# Patient Record
Sex: Female | Born: 1970 | Race: White | Hispanic: No | Marital: Married | Smoking: Former smoker
Health system: Southern US, Community
[De-identification: ages and names within clinical notes are randomized; demographics above are authoritative.]

## PROBLEM LIST (undated history)

## (undated) DIAGNOSIS — R569 Unspecified convulsions: Secondary | ICD-10-CM

## (undated) DIAGNOSIS — H539 Unspecified visual disturbance: Secondary | ICD-10-CM

## (undated) DIAGNOSIS — G35D Multiple sclerosis, unspecified: Secondary | ICD-10-CM

## (undated) DIAGNOSIS — G35 Multiple sclerosis: Secondary | ICD-10-CM

## (undated) HISTORY — DX: Unspecified convulsions: R56.9

## (undated) HISTORY — DX: Multiple sclerosis, unspecified: G35.D

## (undated) HISTORY — DX: Multiple sclerosis: G35

## (undated) HISTORY — DX: Unspecified visual disturbance: H53.9

---

## 2014-12-14 ENCOUNTER — Encounter: Payer: Self-pay | Admitting: *Deleted

## 2014-12-15 ENCOUNTER — Encounter: Payer: Self-pay | Admitting: Neurology

## 2014-12-15 ENCOUNTER — Ambulatory Visit (INDEPENDENT_AMBULATORY_CARE_PROVIDER_SITE_OTHER): Payer: BLUE CROSS/BLUE SHIELD | Admitting: Neurology

## 2014-12-15 VITALS — BP 116/68 | HR 64 | Resp 14 | Ht 68.0 in | Wt 182.0 lb

## 2014-12-15 DIAGNOSIS — R5383 Other fatigue: Secondary | ICD-10-CM | POA: Diagnosis not present

## 2014-12-15 DIAGNOSIS — G40309 Generalized idiopathic epilepsy and epileptic syndromes, not intractable, without status epilepticus: Secondary | ICD-10-CM | POA: Diagnosis not present

## 2014-12-15 DIAGNOSIS — E559 Vitamin D deficiency, unspecified: Secondary | ICD-10-CM | POA: Diagnosis not present

## 2014-12-15 DIAGNOSIS — R269 Unspecified abnormalities of gait and mobility: Secondary | ICD-10-CM

## 2014-12-15 DIAGNOSIS — H532 Diplopia: Secondary | ICD-10-CM | POA: Diagnosis not present

## 2014-12-15 DIAGNOSIS — G35 Multiple sclerosis: Secondary | ICD-10-CM | POA: Diagnosis not present

## 2014-12-15 MED ORDER — MODAFINIL 200 MG PO TABS: 200.0000 mg | ORAL_TABLET | Freq: Two times a day (BID) | ORAL | Status: DC

## 2014-12-15 NOTE — Progress Notes (Signed)
[facility]  PATIENT: Ruth Anderson DOB: 01/16/1971  REFERRING DOCTOR OR PCP:  none SOURCE: patient, husband and records from [facility] neurology and images from [facility].  _________________________________   HISTORICAL  CHIEF COMPLAINT:  Chief Complaint  Patient presents with  . Multiple Sclerosis  . Seizures    HISTORY OF PRESENT ILLNESS:  Ruth Anderson is a 44 year old woman with multiple sclerosis and epilepsy who I have seen for many years while at [facility]. She was diagnosed with MS in 1988 placed on Betaseron when it became available.  Later on she switched to Copaxone and Avonex. When Tysabri became available in 2006, she switched and had 70 infusions over the next 6  years (monthly except for a 6 month holiday).   She tolerated Tysabri well, was stable and was JCV antibody negative. However, she did not like having to do the monthly to hour infusions. In May 2013, she started Tecfidera. She had flushing that persisted after many months but did not have significant GI issues. I last saw her on 07/29/2014 and she reported that her MS remains stable on the Tecfidera. I personally reviewed her MRI of the brain from 2015. It was compared to an MRI dated 12/12/2009. She has signal matter signal changes in the hemispheres consistent with multiple sclerosis. Many of these are radially oriented to the lateral ventricles in the corpus callosum and others are white matter. Brain stem appears normal. There were no acute findings and there was no significant changes when compared to the MRI dated 12/12/2009. Vitamin D was 32.9 in July 2015. Lymphocyte count was 0.8 on 07/29/2014.  Gait/strength/sensation:    She feels gait is baseline -- some stumbling and endurance issues but no recent falls.     She denies any significant weakness or numbness in legs or arms  Bladder/bowel:   She denies any issues with bowel or bladder  Vision: She notes more  blurry vision about a month ago.  She feels it is worse compared to last month but about the same as last week.    She saw her optometrist last week and was diagnosed with left optic neuritis.    She is worse when she is hot or tired.  She notes more blurriness when she is looking right.     Fatigue/sleep: Fatigue has been a problem for many years. At her fatigue did better on the Tysabri than on other medications. Provigil helps the fatigue some. She feels the quality of her sleep is good and she often sleeps in late.   She had one episode of lassitude last month lasting 1/2 day bu tmuch better the following day -- no virus, fever, etc.    Mood/cognition:   She denies any depression or anxiety.   She does feel cognition is slowly worsening.    She notes problems with attention, memory, processing speed and word finding.     She also has a seizure disorder. Her first seizure was in 1998 and they have been triggered by flashing Christmas tree lights. She has had about a dozen seizures overall with most of them occurring out of sleep but with a couple of them occurring during the daytime with severe ictal grogginess. For the most part, the seizures have been controlled on Lamictal 200 mg 3 times a day and Keppra 1500 mg twice a day. Her last seizure was in late 2014. This seizure was prolonged requiring visit to the emergency room where she received IV medications and  she was intubated overnight.  REVIEW OF SYSTEMS: Constitutional: No fevers, chills, sweats, or change in appetite.  Notes some fatigue Eyes: as above.  No eye pain Ear, nose and throat: No hearing loss, ear pain, nasal congestion, sore throat Cardiovascular: No chest pain, palpitations Respiratory: No shortness of breath at rest or with exertion.   No wheezes GastrointestinaI: No nausea, vomiting, diarrhea, abdominal pain, fecal incontinence Genitourinary: No dysuria, urinary retention or frequency.  No nocturia. Musculoskeletal: No  neck pain, back pain Integumentary: No rash, pruritus, skin lesions Neurological: as above Psychiatric: No depression at this time.  No anxiety Endocrine: No palpitations, diaphoresis, change in appetite, change in weigh or increased thirst Hematologic/Lymphatic: No anemia, purpura, petechiae. Allergic/Immunologic: No itchy/runny eyes, nasal congestion, recent allergic reactions, rashes  ALLERGIES: No Known Allergies  HOME MEDICATIONS:  Current outpatient prescriptions:  .  Dimethyl Fumarate 240 MG CPDR, Take 240 mg by mouth 2 (two) times daily., Disp: , Rfl:  .  lamoTRIgine (LAMICTAL) 200 MG tablet, Take 200 mg by mouth 3 (three) times daily., Disp: , Rfl:  .  levETIRAcetam (KEPPRA) 500 MG tablet, Take 500 mg by mouth 2 (two) times daily., Disp: , Rfl:  .  modafinil (PROVIGIL) 100 MG tablet, Take 100 mg by mouth 2 (two) times daily., Disp: , Rfl:   PAST MEDICAL HISTORY: Past Medical History  Diagnosis Date  . Seizures   . Vision abnormalities   . Multiple sclerosis     PAST SURGICAL HISTORY: History reviewed. No pertinent past surgical history.  FAMILY HISTORY: Family History  Problem Relation Age of Onset  . Hypertension Mother   . Heart disease Father   . Hypertension Father   . Stroke Father   . Diabetes type II Father     SOCIAL HISTORY:  History   Social History  . Marital Status: Married    Spouse Name: N/A  . Number of Children: N/A  . Years of Education: N/A   Occupational History  . Not on file.   Social History Main Topics  . Smoking status: Former Games developer  . Smokeless tobacco: Not on file  . Alcohol Use: No  . Drug Use: No  . Sexual Activity: Not on file   Other Topics Concern  . Not on file   Social History Narrative     PHYSICAL EXAM  Filed Vitals:   12/15/14 1259  BP: 116/68  Pulse: 64  Resp: 14  Height: 5\' 8"  (1.727 m)  Weight: 182 lb (82.555 kg)    Body mass index is 27.68 kg/(m^2).   General: The patient is  well-developed and well-nourished and in no acute distress  Eyes:  Funduscopic exam shows normal optic discs and retinal vessels.  Neck: The neck is supple, no carotid bruits are noted.  The neck is nontender.  Cardiovascular: The heart has a regular rate and rhythm with a normal S1 and S2. There were no murmurs, gallops or rubs. Lungs are clear to auscultation.  Skin: Extremities are without significant edema.  Musculoskeletal:  Back is nontender  Neurologic Exam  Mental status: The patient is alert and oriented x 3 at the time of the examination. The patient has apparent normal recent and remote memory, with an apparently normal attention span and concentration ability.   Speech is normal.  Cranial nerves: Extraocular movements show mild decrease abduction on the right. There is no nystagmus. Pupils are equal, round, and reactive to light and accomodation.  She reported symmetric colors. Visual fields are full.  Facial symmetry is present. There is good facial sensation to soft touch bilaterally.Facial strength is normal.  Trapezius and sternocleidomastoid strength is normal. No dysarthria is noted.  The tongue is midline, and the patient has symmetric elevation of the soft palate. No obvious hearing deficits are noted.  Motor:  Muscle bulk is normal.   Tone is mildly increased in leg. Strength is  5 / 5 in all 4 extremities.   Sensory: Sensory testing is intact to pinprick, soft touch and vibration sensation in all 4 extremities.  Coordination: Cerebellar testing reveals good finger-nose-finger and heel-to-shin bilaterally.  Gait and station: Station is normal.   Gait is normal. Tandem gait is wide. Romberg is negative.   Reflexes: Deep tendon reflexes are symmetric and brisk bilaterally.   Plantar responses are flexor.    DIAGNOSTIC DATA (LABS, IMAGING, TESTING) - I reviewed patient records, labs, notes, testing and imaging myself where available.     ASSESSMENT AND  PLAN  Multiple sclerosis - Plan: CBC with Differential, Sedimentation Rate, Vitamin D, 25-hydroxy  Epilepsy, generalized, convulsive  Other fatigue - Plan: CBC with Differential, Sedimentation Rate  Diplopia - Plan: CBC with Differential, Sedimentation Rate  Gait disturbance  Vitamin D deficiency - Plan: Vitamin D, 25-hydroxy   In summary, Acelynn Dejonge is a 44 year old woman with multiple sclerosis who had the onset of diplopia on right gaze last month. This likely represents a mild MS exacerbation. As her symptoms are more than 25 weeks old I do not think that she would benefit from a course of IV steroids. I suspect that she will improve over the next couple months. However, if she does not she may require prism glasses. To make sure that she is not having breakthrough disease on Tecfidera, we will check an MRI of the brain with and without contrast and compare the images with those from last year. If she is having a lot of breakthrough disease we will need to consider a change in therapy. I will also check her vitamin D level as it was low in the past and a CBC with differential to make sure that she does not have a lymphopenia. She is advised to take her Tecfidera after meals to help with the flushing. GU on her epilepsy medications as they seem to be helping her.  She will return to see me in 4 months or sooner if she has new or worsening neurologic symptoms.   Ruth Anderson A. Epimenio Foot, MD, PhD 12/15/2014, 1:09 PM Certified in Neurology, Clinical Neurophysiology, Sleep Medicine, Pain Medicine and Neuroimaging  [facility] [address] [place], [place] [address] [phone]

## 2014-12-16 ENCOUNTER — Telehealth: Payer: Self-pay | Admitting: *Deleted

## 2014-12-16 LAB — VITAMIN D 25 HYDROXY (VIT D DEFICIENCY, FRACTURES): Vit D, 25-Hydroxy: 28.2 ng/mL — ABNORMAL LOW (ref 30.0–100.0)

## 2014-12-16 LAB — CBC WITH DIFFERENTIAL/PLATELET
BASOS: 2 %
Basophils Absolute: 0.1 10*3/uL (ref 0.0–0.2)
EOS: 4 %
Eosinophils Absolute: 0.1 10*3/uL (ref 0.0–0.4)
HCT: 38.5 % (ref 34.0–46.6)
HEMOGLOBIN: 12.8 g/dL (ref 11.1–15.9)
Immature Grans (Abs): 0 10*3/uL (ref 0.0–0.1)
Immature Granulocytes: 0 %
LYMPHS: 31 %
Lymphocytes Absolute: 1.1 10*3/uL (ref 0.7–3.1)
MCH: 30.5 pg (ref 26.6–33.0)
MCHC: 33.2 g/dL (ref 31.5–35.7)
MCV: 92 fL (ref 79–97)
Monocytes Absolute: 0.4 10*3/uL (ref 0.1–0.9)
Monocytes: 11 %
Neutrophils Absolute: 1.8 10*3/uL (ref 1.4–7.0)
Neutrophils Relative %: 52 %
Platelets: 325 10*3/uL (ref 150–379)
RBC: 4.19 x10E6/uL (ref 3.77–5.28)
RDW: 13 % (ref 12.3–15.4)
WBC: 3.4 10*3/uL (ref 3.4–10.8)

## 2014-12-16 LAB — SEDIMENTATION RATE: Sed Rate: 2 mm/hr (ref 0–32)

## 2014-12-16 MED ORDER — VITAMIN D (ERGOCALCIFEROL) 1.25 MG (50000 UNIT) PO CAPS: 50000.0000 [IU] | ORAL_CAPSULE | ORAL | Status: DC

## 2014-12-16 NOTE — Telephone Encounter (Signed)
-----   Message from Asa Lente, MD sent at 12/16/2014 12:54 PM EDT ----- Vit D is milldy low ---  50000 U x 8 weeks then 11-4998 U OTC   Other labs are ok

## 2014-12-16 NOTE — Telephone Encounter (Signed)
Spoke with Ruth Anderson and per RAS, advised her of low vit. d and the need for rx. vit. d 50,000iu po weekly for 8 weeks, then resume otc vit. d 4-5,000iu daily.  Ruth Anderson verbalized understanding of same, and rx. escribed to [facility] per her request/fim

## 2014-12-29 ENCOUNTER — Telehealth: Payer: Self-pay | Admitting: Neurology

## 2014-12-29 NOTE — Telephone Encounter (Signed)
Patient is calling to discuss pain she is having. She would not give any other details. Please call.

## 2014-12-29 NOTE — Telephone Encounter (Signed)
I have spoken with Ruth Anderson.  She c/o neck, left arm pain onset few days ago.  Worse with turning her head to the left.  Denies other sx. such as increased fatigue, visual disturbance.  Does not feel this is an ms exacerbation.  I have offered an appt. with RAS--she will first try heat/ice therapy, ibuprofen.  If pain does not improve or worsens, she will call me back for appt/fim

## 2015-01-26 ENCOUNTER — Telehealth: Payer: Self-pay

## 2015-01-26 MED ORDER — DIMETHYL FUMARATE 240 MG PO CPDR: 240.0000 mg | DELAYED_RELEASE_CAPSULE | Freq: Two times a day (BID) | ORAL | Status: DC

## 2015-01-26 NOTE — Telephone Encounter (Signed)
Rx. escribed to [facility] as requested/fim

## 2015-01-26 NOTE — Telephone Encounter (Signed)
[facility] is requesting a refill on Tecfidera  capsules one twice daily.  Thank you.

## 2015-03-18 ENCOUNTER — Other Ambulatory Visit: Payer: Self-pay | Admitting: Neurology

## 2015-04-27 ENCOUNTER — Ambulatory Visit: Payer: BLUE CROSS/BLUE SHIELD | Admitting: Neurology

## 2015-05-03 ENCOUNTER — Encounter: Payer: Self-pay | Admitting: Neurology

## 2015-05-03 ENCOUNTER — Ambulatory Visit (INDEPENDENT_AMBULATORY_CARE_PROVIDER_SITE_OTHER): Payer: BLUE CROSS/BLUE SHIELD | Admitting: Neurology

## 2015-05-03 VITALS — BP 128/80 | HR 68 | Resp 16 | Ht 68.0 in | Wt 174.0 lb

## 2015-05-03 DIAGNOSIS — R269 Unspecified abnormalities of gait and mobility: Secondary | ICD-10-CM

## 2015-05-03 DIAGNOSIS — H532 Diplopia: Secondary | ICD-10-CM

## 2015-05-03 DIAGNOSIS — R5383 Other fatigue: Secondary | ICD-10-CM

## 2015-05-03 DIAGNOSIS — G40309 Generalized idiopathic epilepsy and epileptic syndromes, not intractable, without status epilepticus: Secondary | ICD-10-CM

## 2015-05-03 DIAGNOSIS — G35 Multiple sclerosis: Secondary | ICD-10-CM

## 2015-05-03 MED ORDER — LEVETIRACETAM 500 MG PO TABS: 500.0000 mg | ORAL_TABLET | Freq: Two times a day (BID) | ORAL | Status: DC

## 2015-05-03 MED ORDER — MODAFINIL 200 MG PO TABS: 200.0000 mg | ORAL_TABLET | Freq: Two times a day (BID) | ORAL | Status: DC

## 2015-05-03 MED ORDER — LAMICTAL 200 MG PO TABS: 200.0000 mg | ORAL_TABLET | Freq: Three times a day (TID) | ORAL | Status: DC

## 2015-05-03 NOTE — Progress Notes (Signed)
[facility]  PATIENT: Ruth Anderson DOB: 30-Nov-1970  REFERRING DOCTOR OR PCP:  none SOURCE: patient, husband and records from [facility] neurology and images from [facility].  _________________________________   HISTORICAL  CHIEF COMPLAINT:  Chief Complaint  Patient presents with  . Multiple Sclerosis    Sts. she continues to tolerate Tecfidera well.  Sts. fatigue is some worse, she thinks due to heat./fim  . Seizures    Denies new sz. activity/fim    HISTORY OF PRESENT ILLNESS:  Ruth Anderson is a 44 year old woman with multiple sclerosis and epilepsy  MS History:  She was diagnosed with MS in 1988 placed on Betaseron when it became available.  Later on she switched to Copaxone and Avonex. When Tysabri became available in 2006, she switched and had 70 infusions over the next 6  years (monthly except for a 6 month holiday).   She tolerated Tysabri well, was stable and was JCV antibody negative. However, she did not like having to do the monthly to hour infusions. In May 2013, she started Tecfidera. She had flushing that persisted after many months but did not have significant GI issues. I last saw her on 07/29/2014 and she reported that her MS remains stable on the Tecfidera. I personally reviewed her MRI of the brain from 2015. It was compared to an MRI dated 12/12/2009. She has signal matter signal changes in the hemispheres consistent with multiple sclerosis. Many of these are radially oriented to the lateral ventricles in the corpus callosum and others are white matter. Brain stem appears normal. There were no acute findings and there was no significant changes when compared to the MRI dated 12/12/2009. Vitamin D was 32.9 in July 2015. Lymphocyte count was 0.8 on 07/29/2014.  Gait/strength/sensation:    She feels gait is baseline.    She walks without a cane but tires out easily and then has stumbling.  No recent falls.     She denies any significant  weakness or numbness in legs or arms   No dysesthetic pain.  Bladder/bowel:   She denies any issues with bowel or bladder.     Vision: She notes some blurry vision that is worse with  looking to the right.  She feels it is worse compared to last month but about the same as last week.      She is worse when she is hot or tired.     Fatigue/sleep: Fatigue is a daily problem for her. Her fatigue did better on the Tysabri than on other medications. Provigil helps the fatigue some. She feels the quality of her sleep is good and she often sleeps in late.     Mood/cognition:   She denies depression or anxiety.   She has had mild fairly stable cognitive loss.    She notes problems with attention, memory, processing speed and word finding.   Provigil helps some.  Seizure:    Her last seizure was in 2014 and she needed intubation.   Her first seizure was in 1998 and they have been triggered by flashing Christmas tree lights. She has had about a dozen seizures overall with most of them occurring out of sleep but with a couple of them occurring during the daytime with severe ictal grogginess. For the most part, the seizures have been controlled on Lamictal 200 mg 3 times a day and Keppra 1500 mg twice a day.   REVIEW OF SYSTEMS: Constitutional: No fevers, chills, sweats, or change in appetite.  Notes  some fatigue Eyes: as above.  No eye pain Ear, nose and throat: No hearing loss, ear pain, nasal congestion, sore throat Cardiovascular: No chest pain, palpitations Respiratory: No shortness of breath at rest or with exertion.   No wheezes GastrointestinaI: No nausea, vomiting, diarrhea, abdominal pain, fecal incontinence Genitourinary: No dysuria, urinary retention or frequency.  No nocturia. Musculoskeletal: No neck pain, back pain Integumentary: No rash, pruritus, skin lesions Neurological: as above Psychiatric: No depression at this time.  No anxiety Endocrine: No palpitations, diaphoresis, change in  appetite, change in weigh or increased thirst Hematologic/Lymphatic: No anemia, purpura, petechiae. Allergic/Immunologic: No itchy/runny eyes, nasal congestion, recent allergic reactions, rashes  ALLERGIES: No Known Allergies  HOME MEDICATIONS:  Current outpatient prescriptions:  .  Dimethyl Fumarate 240 MG CPDR, Take 1 capsule (240 mg total) by mouth 2 (two) times daily., Disp: 60 capsule, Rfl: 11 .  LAMICTAL 200 MG tablet, TAKE 1 TABLET THREE TIMES A DAY, Disp: 270 tablet, Rfl: 1 .  levETIRAcetam (KEPPRA) 500 MG tablet, Take 500 mg by mouth 2 (two) times daily., Disp: , Rfl:  .  modafinil (PROVIGIL) 200 MG tablet, Take 1 tablet (200 mg total) by mouth 2 (two) times daily., Disp: 180 tablet, Rfl: 1 .  Vitamin D, Ergocalciferol, (DRISDOL) 50000 UNITS CAPS capsule, Take 1 capsule (50,000 Units total) by mouth every 7 (seven) days., Disp: 8 capsule, Rfl: 0  PAST MEDICAL HISTORY: Past Medical History  Diagnosis Date  . Seizures   . Vision abnormalities   . Multiple sclerosis     PAST SURGICAL HISTORY: History reviewed. No pertinent past surgical history.  FAMILY HISTORY: Family History  Problem Relation Age of Onset  . Hypertension Mother   . Heart disease Father   . Hypertension Father   . Stroke Father   . Diabetes type II Father     SOCIAL HISTORY:  Social History   Social History  . Marital Status: Married    Spouse Name: N/A  . Number of Children: N/A  . Years of Education: N/A   Occupational History  . Not on file.   Social History Main Topics  . Smoking status: Former Games developer  . Smokeless tobacco: Not on file  . Alcohol Use: No  . Drug Use: No  . Sexual Activity: Not on file   Other Topics Concern  . Not on file   Social History Narrative     PHYSICAL EXAM  Filed Vitals:   05/03/15 1553  BP: 128/80  Pulse: 68  Resp: 16  Height: 5\' 8"  (1.727 m)  Weight: 174 lb (78.926 kg)    Body mass index is 26.46 kg/(m^2).   General: The patient is  well-developed and well-nourished and in no acute distress  Eyes:  Funduscopic exam shows normal optic discs and retinal vessels.  Neck: The neck is supple, no carotid bruits are noted.  The neck is nontender.  Cardiovascular: The heart has a regular rate and rhythm with a normal S1 and S2. There were no murmurs, gallops or rubs. Lungs are clear to auscultation.  Skin: Extremities are without significant edema.  Musculoskeletal:  Back is nontender  Neurologic Exam  Mental status: The patient is alert and oriented x 3 at the time of the examination. The patient has apparent normal recent and remote memory, with an apparently normal attention span and concentration ability.   Speech is normal.  Cranial nerves: Extraocular movements show mild diplopia to right (esp right and up gaze).   There is no  nystagmus. Pupils are equal, round, and reactive to light and accomodation.  She reported symmetric colors. Visual fields are full.  Facial symmetry is present. There is good facial sensation to soft touch bilaterally.Facial strength is normal.  Trapezius and sternocleidomastoid strength is normal. No dysarthria is noted.   No obvious hearing deficits are noted.  Motor:  Muscle bulk is normal.   Tone is mildly increased in leg. Strength is  5 / 5 in all 4 extremities.   Sensory: Sensory testing is intact to pinprick, soft touch and vibration sensation in all 4 extremities.  Coordination: Cerebellar testing reveals good finger-nose-finger and heel-to-shin bilaterally.  Gait and station: Station is normal.   Gait is normal. Tandem gait is wide. Romberg is negative.   Reflexes: Deep tendon reflexes are symmetric and brisk bilaterally.   Plantar responses are flexor.    DIAGNOSTIC DATA (LABS, IMAGING, TESTING) - I reviewed patient records, labs, notes, testing and imaging myself where available.     ASSESSMENT AND PLAN  Multiple sclerosis  Epilepsy, generalized, convulsive  Gait  disturbance  Other fatigue  Diplopia   1.   Her diplopia is slightly better but not resolved. If she is no better in 2-3 months, consider referring to a neuro-ophthalmologist for prism glasses.    2.   She will continue on her current dose of lamotrigine and Keppra (brand necessary). 3.  Continue Provigil for MS related fatigue and sleepiness. 4.   She will continue to stay active and exercises as tolerated. 5.  She will return to see me in 6 months or sooner if she has new or worsening neurologic symptoms.   Richard A. Epimenio Foot, MD, PhD 05/03/2015, 4:03 PM Certified in Neurology, Clinical Neurophysiology, Sleep Medicine, Pain Medicine and Neuroimaging  [facility] [address], [address] [place], [place] [address] [phone]

## 2015-05-09 ENCOUNTER — Telehealth: Payer: Self-pay | Admitting: Neurology

## 2015-05-09 MED ORDER — LEVETIRACETAM 500 MG PO TABS: 500.0000 mg | ORAL_TABLET | Freq: Two times a day (BID) | ORAL | Status: DC

## 2015-05-09 NOTE — Telephone Encounter (Signed)
Pt called and stated that the pharmacy has not received the information needed for her Keppra.  They say they have received the rx but all the information is not on it to be able to fill it.  The best number to reach her is [phone].

## 2015-05-09 NOTE — Telephone Encounter (Signed)
I have spoken with Ruth Anderson this morning.  RAS escribed Keppra rx. to [facility] on 05-03-15, and I have sent it again this morning/fim

## 2015-05-16 ENCOUNTER — Telehealth: Payer: Self-pay | Admitting: Neurology

## 2015-05-16 MED ORDER — DIMETHYL FUMARATE 240 MG PO CPDR: 240.0000 mg | DELAYED_RELEASE_CAPSULE | Freq: Two times a day (BID) | ORAL | Status: DC

## 2015-05-16 MED ORDER — LEVETIRACETAM 750 MG PO TABS: 1500.0000 mg | ORAL_TABLET | Freq: Two times a day (BID) | ORAL | Status: DC

## 2015-05-16 MED ORDER — LEVETIRACETAM 750 MG PO TABS: 750.0000 mg | ORAL_TABLET | Freq: Two times a day (BID) | ORAL | Status: DC

## 2015-05-16 MED ORDER — NITROFURANTOIN MONOHYD MACRO 100 MG PO CAPS: 100.0000 mg | ORAL_CAPSULE | Freq: Two times a day (BID) | ORAL | Status: DC

## 2015-05-16 NOTE — Telephone Encounter (Signed)
I have spoken with Ruth Anderson this morning.  She has been taking Keppra 1500mg  bid.  Last rx. sent in was for 500mg  bid.  She will take 3 500mg  tabs, for total of 1500mg  bid, and I have escribed new rx. for Keppra 750mg  2 tabs bid, brand name medically necessary/fim

## 2015-05-16 NOTE — Telephone Encounter (Addendum)
Patient is calling and states she receives her Rx Levetiracetam by mail.  She stated she has been taking 2 tablets 750 mg tablets at a time 2 times a day and stated she received her last prescription for Levetiracetam @1  tablet 500mg  twice per day.  Please call patient.

## 2015-05-16 NOTE — Telephone Encounter (Signed)
Patient called stating she was informed today by [facility] the Tecfidera had been denied in May 2016. She is just now needing refill. She will be out this week. She is requesting a call to be made to them @ [phone]. Please call and advise. Patient can be reached at [phone].

## 2015-05-16 NOTE — Telephone Encounter (Signed)
I have spoken with Ruth Anderson.  She requested Tecfidera rx. be sent to [facility], and Macrodantin be sent to [facility].  I have escribed rx's as requested/fim

## 2015-07-18 ENCOUNTER — Telehealth: Payer: Self-pay

## 2015-07-18 MED ORDER — IBUPROFEN 800 MG PO TABS: 800.0000 mg | ORAL_TABLET | Freq: Three times a day (TID) | ORAL | Status: DC | PRN

## 2015-07-18 NOTE — Telephone Encounter (Signed)
[facility] is requesting a new Rx for Ibuprofen 800mg  one three times daily #270.  This drug is not currently on med list.  Okay to add and send Rx?  Please advise.  Thank you!

## 2015-07-18 NOTE — Telephone Encounter (Signed)
Thank you!  Rx has been added and sent.  Receipt confirmed by pharmacy.

## 2015-07-18 NOTE — Telephone Encounter (Signed)
Ok to add and send in script.    Thank you,

## 2015-09-02 ENCOUNTER — Encounter: Payer: Self-pay | Admitting: Neurology

## 2015-10-31 ENCOUNTER — Encounter: Payer: Self-pay | Admitting: Neurology

## 2015-10-31 ENCOUNTER — Ambulatory Visit (INDEPENDENT_AMBULATORY_CARE_PROVIDER_SITE_OTHER): Payer: BLUE CROSS/BLUE SHIELD | Admitting: Neurology

## 2015-10-31 VITALS — BP 122/86 | HR 80 | Resp 16 | Ht 68.0 in | Wt 184.0 lb

## 2015-10-31 DIAGNOSIS — G40309 Generalized idiopathic epilepsy and epileptic syndromes, not intractable, without status epilepticus: Secondary | ICD-10-CM

## 2015-10-31 DIAGNOSIS — H532 Diplopia: Secondary | ICD-10-CM | POA: Diagnosis not present

## 2015-10-31 DIAGNOSIS — G35 Multiple sclerosis: Secondary | ICD-10-CM | POA: Diagnosis not present

## 2015-10-31 DIAGNOSIS — R3911 Hesitancy of micturition: Secondary | ICD-10-CM | POA: Diagnosis not present

## 2015-10-31 DIAGNOSIS — R5383 Other fatigue: Secondary | ICD-10-CM | POA: Diagnosis not present

## 2015-10-31 DIAGNOSIS — R269 Unspecified abnormalities of gait and mobility: Secondary | ICD-10-CM | POA: Diagnosis not present

## 2015-10-31 DIAGNOSIS — R4189 Other symptoms and signs involving cognitive functions and awareness: Secondary | ICD-10-CM | POA: Diagnosis not present

## 2015-10-31 DIAGNOSIS — N3000 Acute cystitis without hematuria: Secondary | ICD-10-CM

## 2015-10-31 DIAGNOSIS — N390 Urinary tract infection, site not specified: Secondary | ICD-10-CM | POA: Insufficient documentation

## 2015-10-31 MED ORDER — NITROFURANTOIN MONOHYD MACRO 100 MG PO CAPS: 100.0000 mg | ORAL_CAPSULE | Freq: Two times a day (BID) | ORAL | Status: DC

## 2015-10-31 MED ORDER — MODAFINIL 200 MG PO TABS: 200.0000 mg | ORAL_TABLET | Freq: Two times a day (BID) | ORAL | Status: DC

## 2015-10-31 NOTE — Progress Notes (Signed)
[facility]  PATIENT: Ruth Anderson DOB: May 27, 1971  REFERRING DOCTOR OR PCP:  none SOURCE: patient, husband and records from [facility] neurology and images from [facility].  _________________________________   HISTORICAL  CHIEF COMPLAINT:  Chief Complaint  Patient presents with  . Multiple Sclerosis    Sts. she continues to tolerate Tecfidera well.  Sts. she has had cold sx. , increased fatigue for the last 4 days.  She denies new sx. activity and sts. is compliant with brand name Lamotrigine and Keppra.  She needs a r/f of Macrobid, which she takes daily to prevent uti's/fim  . Seizures    HISTORY OF PRESENT ILLNESS:  Ruth Anderson is a 45 year old woman with multiple sclerosis and epilepsy.    She is on Tecfidera and tolerates it well.  She deneis any actual exacerbation but has noted more numbness of fingers in both hands.      Gait/strength/sensation:    She feels gait is baseline.  She has no recent falls (only ne in last 6 months when she hit something while walking).    She walks without a cane but tires out easily and then has stumbling.    She denies any significant weakness but legs tire out easily and rarely give out.   She has some numbness in her hands.     No dysesthetic pain.  Bladder/bowel:   She denies any issues with bowel.   She has some urinary urgency but no incontinence.    She has nocturia.   She has more hesitancy and does not always empty.   She has had frequent UTI's and used ot be on Macrobid with benefit.     She thinks she may be having a UTI.    Vision: She notes some diplopia with gaze to the right.  She feels it is worse.     Diplopia is worse when she is tired.     Fatigue/sleep: Fatigue is variable, bad some days.   Her fatigue did better on the Tysabri than on Tecfidera. Provigil helps the fatigue some. She feels the quality of her sleep is good and she often sleeps in late.     Mood/cognition:   She denies major  depression or anxiety. However, she has been sadder for a month or two after her sister died.     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    She notes problems with attention, memory, processing speed and word finding.   Provigil helps some.  Seizure:    Her last seizure was in 2014 and she needed intubation.   Her first seizure was in 1998 and they have been triggered by flashing Christmas tree lights. She has had about a dozen seizures overall with most of them occurring out of sleep but with a couple of them occurring during the daytime with severe ictal grogginess. For the most part, the seizures have been controlled on Lamictal 200 mg 3 times a day and Keppra 1500 mg twice a day.    Of note, she had breakthrough seizure on generic so we have been writing for the brand.      MS History:  She was diagnosed with MS in 1988 placed on Betaseron when it became available.  Later on she switched to Copaxone and Avonex. When Tysabri became available in 2006, she switched and had 70 infusions over the next 6  years (monthly except for a 6 month holiday).   She  tolerated Tysabri well, was stable and was JCV antibody negative. However, she did not like having to do the monthly to hour infusions. In May 2013, she started Tecfidera. She had flushing that persisted after many months but did not have significant GI issues. I last saw her on 07/29/2014 and she reported that her MS remains stable on the Tecfidera. I personally reviewed her MRI of the brain from 2015. It was compared to an MRI dated 12/12/2009. She has signal matter signal changes in the hemispheres consistent with multiple sclerosis. Many of these are radially oriented to the lateral ventricles in the corpus callosum and others are white matter. Brain stem appears normal. There were no acute findings and there was no significant changes when compared to the MRI dated 12/12/2009. Vitamin D was 32.9 in July 2015. Lymphocyte count  was 0.8 on 07/29/2014.   REVIEW OF SYSTEMS: Constitutional: No fevers, chills, sweats, or change in appetite.  Notes some fatigue Eyes: as above.  No eye pain Ear, nose and throat: No hearing loss, ear pain, nasal congestion, sore throat Cardiovascular: No chest pain, palpitations Respiratory: No shortness of breath at rest or with exertion.   No wheezes GastrointestinaI: No nausea, vomiting, diarrhea, abdominal pain, fecal incontinence Genitourinary: No dysuria, urinary retention or frequency.  No nocturia. Musculoskeletal: No neck pain, back pain Integumentary: No rash, pruritus, skin lesions Neurological: as above Psychiatric: No depression at this time.  No anxiety Endocrine: No palpitations, diaphoresis, change in appetite, change in weigh or increased thirst Hematologic/Lymphatic: No anemia, purpura, petechiae. Allergic/Immunologic: No itchy/runny eyes, nasal congestion, recent allergic reactions, rashes  ALLERGIES: No Known Allergies  HOME MEDICATIONS:  Current outpatient prescriptions:  .  Dimethyl Fumarate 240 MG CPDR, Take 1 capsule (240 mg total) by mouth 2 (two) times daily., Disp: 180 capsule, Rfl: 3 .  ibuprofen (ADVIL,MOTRIN) 800 MG tablet, Take 1 tablet (800 mg total) by mouth 3 (three) times daily as needed., Disp: 270 tablet, Rfl: 1 .  LAMICTAL 200 MG tablet, Take 1 tablet (200 mg total) by mouth 3 (three) times daily. BRAND NAME LAMICTAL FOR SEIZURE DISORDER, Disp: 270 tablet, Rfl: 3 .  levETIRAcetam (KEPPRA) 750 MG tablet, Take 2 tablets (1,500 mg total) by mouth 2 (two) times daily., Disp: 360 tablet, Rfl: 3 .  modafinil (PROVIGIL) 200 MG tablet, Take 1 tablet (200 mg total) by mouth 2 (two) times daily., Disp: 180 tablet, Rfl: 1 .  nitrofurantoin, macrocrystal-monohydrate, (MACROBID) 100 MG capsule, Take 1 capsule (100 mg total) by mouth 2 (two) times daily., Disp: 180 capsule, Rfl: 3 .  Vitamin D, Ergocalciferol, (DRISDOL) 50000 UNITS CAPS capsule, Take 1  capsule (50,000 Units total) by mouth every 7 (seven) days., Disp: 8 capsule, Rfl: 0  PAST MEDICAL HISTORY: Past Medical History  Diagnosis Date  . Seizures (HCC)   . Vision abnormalities   . Multiple sclerosis (HCC)     PAST SURGICAL HISTORY: History reviewed. No pertinent past surgical history.  FAMILY HISTORY: Family History  Problem Relation Age of Onset  . Hypertension Mother   . Heart disease Father   . Hypertension Father   . Stroke Father   . Diabetes type II Father     SOCIAL HISTORY:  Social History   Social History  . Marital Status: Married    Spouse Name: N/A  . Number of Children: N/A  . Years of Education: N/A   Occupational History  . Not on file.   Social History Main Topics  . Smoking status:  Former Smoker  . Smokeless tobacco: Not on file  . Alcohol Use: No  . Drug Use: No  . Sexual Activity: Not on file   Other Topics Concern  . Not on file   Social History Narrative     PHYSICAL EXAM  Filed Vitals:   10/31/15 1058  BP: 122/86  Pulse: 80  Resp: 16  Height: 5\' 8"  (1.727 m)  Weight: 184 lb (83.462 kg)    Body mass index is 27.98 kg/(m^2).   General: The patient is well-developed and well-nourished and in no acute distress  Eyes:  Funduscopic exam shows normal optic discs and retinal vessels.  Neck: The neck is supple, no carotid bruits are noted.  The neck is nontender.  Cardiovascular: The heart has a regular rate and rhythm with a normal S1 and S2. There were no murmurs, gallops or rubs.    Skin: Extremities are without significant edema.  Musculoskeletal:  Back is nontender  Neurologic Exam  Mental status: The patient is alert and oriented x 3 at the time of the examination. The patient has apparent normal recent and remote memory, with an apparently normal attention span and concentration ability.   Speech is normal.  Cranial nerves: Extraocular movements show mild diplopia to right (esp right and up gaze).   There  is no nystagmus. Pupils are equal, round, and reactive to light and accomodation.  She reported symmetric colors. Visual fields are full.  Facial symmetry is present. There is good facial sensation to soft touch bilaterally.Facial strength is normal.  Trapezius and sternocleidomastoid strength is normal. No dysarthria is noted.   No obvious hearing deficits are noted.  Motor:  Muscle bulk is normal.   Tone is mildly increased in leg. Strength is  5 / 5 in all 4 extremities.   Sensory: Sensory testing is intact to pinprick, soft touch and vibration sensation in all 4 extremities.  Coordination: Cerebellar testing reveals good finger-nose-finger and heel-to-shin bilaterally.  Gait and station: Station is normal.   Gait is normal. Tandem gait is wide. Romberg is negative.   Reflexes: Deep tendon reflexes are symmetric and brisk bilaterally.        DIAGNOSTIC DATA (LABS, IMAGING, TESTING) - I reviewed patient records, labs, notes, testing and imaging myself where available.     ASSESSMENT AND PLAN  Multiple sclerosis (HCC) - Plan: CBC with Differential/Platelet, MR Brain W Wo Contrast  Epilepsy, generalized, convulsive (HCC) - Plan: MR Brain W Wo Contrast  Gait disturbance - Plan: MR Brain W Wo Contrast  Diplopia  Other fatigue  Acute cystitis without hematuria  Urinary hesitancy  Disturbed cognition    1.   Continue Tecfidera for MS as her DMT. We will check a CBC with differential today to make sure that there is not severe lymphopenia. If present, we will need to switch to a different medication. Additionally, we will check an MRI of the brain to make sure that she has not had subclinical progression of her MS well on Tecfidera. 2.   We discussed prism glasses for her diplopia. At this time, she would like to hold off on referral.  3.   She will continue on her current dose of Lamictal and Keppra (brand necessary as she had a severe breakthrough seizure while on generic  requiring intubation).   She is advised to get a good night rest daily. 4.  Continue Provigil for MS related fatigue and sleepiness.   I will renew today 5.   She  will continue to stay active and exercises as tolerated. 6.  She will return to see me in 6 months or sooner if she has new or worsening neurologic symptoms.  40 minute face-to-face interaction with greater than one half of the time counseling or coordinating care about her MS and related symptoms.  Khalessi Blough A. Zailynn Brandel, MD, PhD 10/31/2015, 11:11[facility] Certified in Neurology, Clinical Neurophysiology, Sleep Medicine, Pain Medicine and Neuroimaging  [facility] [address], [address] [place], [place] [address] [phone]

## 2015-11-01 ENCOUNTER — Telehealth: Payer: Self-pay | Admitting: *Deleted

## 2015-11-01 LAB — CBC WITH DIFFERENTIAL/PLATELET
BASOS: 1 %
Basophils Absolute: 0 10*3/uL (ref 0.0–0.2)
EOS (ABSOLUTE): 0.1 10*3/uL (ref 0.0–0.4)
EOS: 2 %
HEMATOCRIT: 41.1 % (ref 34.0–46.6)
Hemoglobin: 13.9 g/dL (ref 11.1–15.9)
Immature Grans (Abs): 0 10*3/uL (ref 0.0–0.1)
Immature Granulocytes: 0 %
LYMPHS ABS: 1.1 10*3/uL (ref 0.7–3.1)
Lymphs: 28 %
MCH: 30.7 pg (ref 26.6–33.0)
MCHC: 33.8 g/dL (ref 31.5–35.7)
MCV: 91 fL (ref 79–97)
MONOS ABS: 0.6 10*3/uL (ref 0.1–0.9)
Monocytes: 14 %
NEUTROS ABS: 2.2 10*3/uL (ref 1.4–7.0)
Neutrophils: 55 %
Platelets: 214 10*3/uL (ref 150–379)
RBC: 4.53 x10E6/uL (ref 3.77–5.28)
RDW: 13.3 % (ref 12.3–15.4)
WBC: 3.9 10*3/uL (ref 3.4–10.8)

## 2015-11-01 NOTE — Telephone Encounter (Signed)
-----   Message from Asa Lente, MD sent at 11/01/2015 10:12 AM EST ----- Please note that the blood count looks good.

## 2015-11-01 NOTE — Telephone Encounter (Signed)
I have spoken with Ruth Anderson and per RAS, advised that labs done in our office look good; she can continue meds as rx'd.  She verbalized understanding of same/fim

## 2015-11-02 ENCOUNTER — Ambulatory Visit (INDEPENDENT_AMBULATORY_CARE_PROVIDER_SITE_OTHER): Payer: BLUE CROSS/BLUE SHIELD

## 2015-11-02 DIAGNOSIS — G40309 Generalized idiopathic epilepsy and epileptic syndromes, not intractable, without status epilepticus: Secondary | ICD-10-CM | POA: Diagnosis not present

## 2015-11-02 DIAGNOSIS — R269 Unspecified abnormalities of gait and mobility: Secondary | ICD-10-CM

## 2015-11-02 DIAGNOSIS — G35 Multiple sclerosis: Secondary | ICD-10-CM | POA: Diagnosis not present

## 2015-11-03 MED ORDER — GADOPENTETATE DIMEGLUMINE 469.01 MG/ML IV SOLN: 17.0000 mL | Freq: Once | INTRAVENOUS | Status: AC | PRN

## 2015-11-04 ENCOUNTER — Telehealth: Payer: Self-pay | Admitting: *Deleted

## 2015-11-04 MED ORDER — AZITHROMYCIN 250 MG PO TABS: ORAL_TABLET | ORAL | Status: DC

## 2015-11-04 NOTE — Telephone Encounter (Signed)
I have spoken with Ruth Anderson and per RAS, advised that mri brain showed MS plaques that were unchanged from the 2014 mri.  Advised she also had chronic sinusitis.  She does report some sx. of chronic sinusitis and requests Z-pk. be escribed to [facility].  I have done this/fim

## 2015-11-04 NOTE — Telephone Encounter (Signed)
-----   Message from Asa Lente, MD sent at 11/04/2015 11:46 AM EST ----- Please let her know that the MRI of the brain shows her MS plaques that appeared to be unchanged when compared to the 2014 MRI. She does have some chronic sinusitis. If she has any symptoms related to chronic sinusitis, please let me know and we can call in a Z-Pak

## 2015-11-08 ENCOUNTER — Telehealth: Payer: Self-pay | Admitting: Neurology

## 2015-11-08 NOTE — Telephone Encounter (Signed)
Patient is calling about her medication for fatigue. She was unprepared and did not know the name of it. She is requesting a call back.

## 2015-11-08 NOTE — Telephone Encounter (Signed)
I have spoken with Ruth Anderson--she would like future Provigil rx's to be faxed to [facility]./fim

## 2015-11-14 ENCOUNTER — Telehealth: Payer: Self-pay | Admitting: Neurology

## 2015-11-14 NOTE — Telephone Encounter (Signed)
[facility] is calling to get a renewal for Rx modafinil 200 mg tablets.  Please call #[phone] and use reference [id number].  Thanks!

## 2015-11-14 NOTE — Telephone Encounter (Signed)
Rx. for Modafinil 200mg  # 180 with one additional r/f; sig: one po bid was escribed to [facility] on 10-31-15.  I have given verbal confirmation of that rx. today/fim

## 2016-01-13 ENCOUNTER — Telehealth: Payer: Self-pay | Admitting: *Deleted

## 2016-01-13 NOTE — Telephone Encounter (Signed)
------------------------------------------------------------   HILIANA EILTS               CID [id number]  Patient SAME                 Pt's Dr Ep[facility] Foot        Area Code 336 Phone# [phone] * DOB September 09, 2070    RE WOULD LIKE TO KNOW WHEN SHE STARTED ONE OF        HER MEDICATIONS                                      Disp:Y/N N If Y = C/B If No Response In ============================================================

## 2016-01-13 NOTE — Telephone Encounter (Signed)
Returned pt TC. She would like to know when she first started taking Provigil. Let her know that 1st order recorded in [facility] was April 2016 but she says that isn't correct. Reports that she's been taking it 10 yrs or more. Suggested that she could also check w/ her pharmacy.

## 2016-01-19 ENCOUNTER — Telehealth: Payer: Self-pay | Admitting: *Deleted

## 2016-01-19 MED ORDER — IBUPROFEN 800 MG PO TABS: 800.0000 mg | ORAL_TABLET | Freq: Three times a day (TID) | ORAL | Status: DC | PRN

## 2016-01-19 NOTE — Telephone Encounter (Signed)
Ibuprofen escribed to [facility] per faxed request/fim 

## 2016-02-13 ENCOUNTER — Telehealth: Payer: Self-pay | Admitting: Neurology

## 2016-02-13 NOTE — Telephone Encounter (Signed)
I have spoken with Ruth Anderson this afternoon.  She asked if she has to see a specialist for prism glasses for diplopia, as discussed at last ov.  Per RAS, I have advised just about any opthal.  should be able to arrange for these.  She verbalized understanding of same--will f/u with her opthal./fim

## 2016-02-13 NOTE — Telephone Encounter (Signed)
Pt called in to speak with nurse about her vision. Please call and advise

## 2016-05-02 ENCOUNTER — Ambulatory Visit (INDEPENDENT_AMBULATORY_CARE_PROVIDER_SITE_OTHER): Payer: BLUE CROSS/BLUE SHIELD | Admitting: Neurology

## 2016-05-02 ENCOUNTER — Encounter: Payer: Self-pay | Admitting: Neurology

## 2016-05-02 VITALS — BP 126/74 | HR 76 | Resp 16 | Ht 68.0 in | Wt 178.5 lb

## 2016-05-02 DIAGNOSIS — H532 Diplopia: Secondary | ICD-10-CM

## 2016-05-02 DIAGNOSIS — R5383 Other fatigue: Secondary | ICD-10-CM | POA: Diagnosis not present

## 2016-05-02 DIAGNOSIS — G35 Multiple sclerosis: Secondary | ICD-10-CM

## 2016-05-02 DIAGNOSIS — E669 Obesity, unspecified: Secondary | ICD-10-CM | POA: Diagnosis not present

## 2016-05-02 DIAGNOSIS — R269 Unspecified abnormalities of gait and mobility: Secondary | ICD-10-CM

## 2016-05-02 DIAGNOSIS — G40309 Generalized idiopathic epilepsy and epileptic syndromes, not intractable, without status epilepticus: Secondary | ICD-10-CM

## 2016-05-02 MED ORDER — LEVETIRACETAM 750 MG PO TABS: 1500.0000 mg | ORAL_TABLET | Freq: Two times a day (BID) | ORAL | 3 refills | Status: DC

## 2016-05-02 MED ORDER — MODAFINIL 200 MG PO TABS: 200.0000 mg | ORAL_TABLET | Freq: Two times a day (BID) | ORAL | 1 refills | Status: DC

## 2016-05-02 MED ORDER — PHENTERMINE HCL 37.5 MG PO CAPS: 37.5000 mg | ORAL_CAPSULE | ORAL | 5 refills | Status: DC

## 2016-05-02 MED ORDER — LAMICTAL 200 MG PO TABS: 200.0000 mg | ORAL_TABLET | Freq: Three times a day (TID) | ORAL | 3 refills | Status: DC

## 2016-05-02 NOTE — Progress Notes (Signed)
[facility]  PATIENT: Ruth Anderson DOB: September 07, 1970  REFERRING DOCTOR OR PCP:  none SOURCE: patient, husband and records [facility] and images from [facility].  _________________________________   HISTORICAL  CHIEF COMPLAINT:  Chief Complaint  Patient presents with  . Multiple Sclerosis    Sts. she continues to tolerate Tecfidera well.  Sts. she gets fatigued easier.  Sts. is compliant with brand name keppra and Lamictal, and denies recent sz. actiity.  Last sz. was almost 2 yrs. ago/fim  . Seizures    HISTORY OF PRESENT ILLNESS:  Ruth NumbersLeigh Tetterton is a 45 year old woman with multiple sclerosis and epilepsy.    She is on Tecfidera and tolerates it well.  She deneis any actual exacerbation but has noted more numbness of fingers in both hands.    I personally reviewed the MRI of the brain dated 11/04/2015 and compared to  the MRI from 11/25/2012. There is no definite change in the interim  Gait/strength/sensation:    She feels gait is baseline with frequent stumbles and falls every month or so.   She has a cane but walks without it.   She also but tires out easily and then has stumbling.    She notes mild left leg weakness with foot drop when tired.   E    She has some numbness in her hands.     No dysesthetic pain.  Bladder/bowel:   She has some urinary urgency but no incontinence.    She has nocturia.   She has more hesitancy and does not always empty.   She has had frequent UTI's and used ot be on Macrobid with benefit.     She thinks she may be having a UTI.   She denies any issues with bowel.      She prefers not to take any more med's.     Vision: She notes some diplopia with gaze to the right.  She feels it is worse.     Diplopia is worse when she is tired.     Fatigue/sleep: Fatigue is worse, both mental and physical.   Her fatigue did better on the Tysabri than on Tecfidera. Provigil helps the fatigue some but not completely. She feels the  quality of her sleep is good and she often sleeps in late.   Ritalin helped fatigue in the past some also.    Mood/cognition:   She denies major depression or anxiety. However, she has been sadder for a month or two after her sister died.     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    She notes problems with attention, memory, processing speed and word finding.   Provigil helps some.   In the past, she was on Ritalin with benefit and better attention/focus.     Seizure:    Her last seizure was in 2014 and she needed intubation.   Her first seizure was in 1998 and they have been triggered by flashing Christmas tree lights. She has had about a dozen seizures overall with most of them occurring out of sleep but with a couple of them occurring during the daytime with severe ictal grogginess. For the most part, the seizures have been controlled on Lamictal 200 mg 3 times a day and Keppra 1500 mg twice a day.    Of note, she had breakthrough seizure on generic so we have been writing for the brand.      MS History:  She was diagnosed with MS in 1988 placed on Betaseron when it became available.  Later on she switched to Copaxone and Avonex. When Tysabri became available in 2006, she switched and had 70 infusions over the next 6  years (monthly except for a 6 month holiday).   She tolerated Tysabri well, was stable and was JCV antibody negative. However, she did not like having to do the monthly to hour infusions. In May 2013, she started Tecfidera. She had flushing that persisted after many months but did not have significant GI issues. I last saw her on 07/29/2014 and she reported that her MS remains stable on the Tecfidera. I personally reviewed her MRI of the brain from 2015. It was compared to an MRI dated 12/12/2009. She has signal matter signal changes in the hemispheres consistent with multiple sclerosis. Many of these are radially oriented to the lateral ventricles in the corpus  callosum and others are white matter. Brain stem appears normal. There were no acute findings and there was no significant changes when compared to the MRI dated 12/12/2009. Vitamin D was 32.9 in July 2015. Lymphocyte count was 0.8 on 07/29/2014.   REVIEW OF SYSTEMS: Constitutional: No fevers, chills, sweats, or change in appetite.  Notes some fatigue Eyes: as above.  No eye pain Ear, nose and throat: No hearing loss, ear pain, nasal congestion, sore throat Cardiovascular: No chest pain, palpitations Respiratory: No shortness of breath at rest or with exertion.   No wheezes GastrointestinaI: No nausea, vomiting, diarrhea, abdominal pain, fecal incontinence Genitourinary: No dysuria, urinary retention or frequency.  No nocturia. Musculoskeletal: No neck pain, back pain Integumentary: No rash, pruritus, skin lesions Neurological: as above Psychiatric: No depression at this time.  No anxiety Endocrine: No palpitations, diaphoresis, change in appetite, change in weigh or increased thirst Hematologic/Lymphatic: No anemia, purpura, petechiae. Allergic/Immunologic: No itchy/runny eyes, nasal congestion, recent allergic reactions, rashes  ALLERGIES: No Known Allergies  HOME MEDICATIONS:  Current Outpatient Prescriptions:  .  azithromycin (ZITHROMAX Z-PAK) 250 MG tablet, Take one tablet daily until gone.  May give generic., Disp: 6 each, Rfl: 0 .  Dimethyl Fumarate 240 MG CPDR, Take 1 capsule (240 mg total) by mouth 2 (two) times daily., Disp: 180 capsule, Rfl: 3 .  ibuprofen (ADVIL,MOTRIN) 800 MG tablet, Take 1 tablet (800 mg total) by mouth 3 (three) times daily as needed., Disp: 270 tablet, Rfl: 1 .  LAMICTAL 200 MG tablet, Take 1 tablet (200 mg total) by mouth 3 (three) times daily. BRAND NAME LAMICTAL FOR SEIZURE DISORDER, Disp: 270 tablet, Rfl: 3 .  levETIRAcetam (KEPPRA) 750 MG tablet, Take 2 tablets (1,500 mg total) by mouth 2 (two) times daily., Disp: 360 tablet, Rfl: 3 .  modafinil  (PROVIGIL) 200 MG tablet, Take 1 tablet (200 mg total) by mouth 2 (two) times daily., Disp: 180 tablet, Rfl: 1 .  nitrofurantoin, macrocrystal-monohydrate, (MACROBID) 100 MG capsule, Take 1 capsule (100 mg total) by mouth 2 (two) times daily., Disp: 180 capsule, Rfl: 3 .  Vitamin D, Ergocalciferol, (DRISDOL) 50000 UNITS CAPS capsule, Take 1 capsule (50,000 Units total) by mouth every 7 (seven) days., Disp: 8 capsule, Rfl: 0 No current facility-administered medications for this visit.   Facility-Administered Medications Ordered in Other Visits:  .  gadopentetate dimeglumine (MAGNEVIST) injection 17 mL, 17 mL, Intravenous, Once PRN, Asa Lente, MD  PAST MEDICAL HISTORY: Past Medical History:  Diagnosis Date  . Multiple sclerosis (HCC)   . Seizures (HCC)   . Vision abnormalities  PAST SURGICAL HISTORY: No past surgical history on file.  FAMILY HISTORY: Family History  Problem Relation Age of Onset  . Hypertension Mother   . Heart disease Father   . Hypertension Father   . Stroke Father   . Diabetes type II Father     SOCIAL HISTORY:  Social History   Social History  . Marital status: Married    Spouse name: N/A  . Number of children: N/A  . Years of education: N/A   Occupational History  . Not on file.   Social History Main Topics  . Smoking status: Former Games developer  . Smokeless tobacco: Not on file  . Alcohol use No  . Drug use: No  . Sexual activity: Not on file   Other Topics Concern  . Not on file   Social History Narrative  . No narrative on file     PHYSICAL EXAM  Vitals:   05/02/16 1112  BP: 126/74  Pulse: 76  Resp: 16  Weight: 178 lb 8 oz (81 kg)  Height: 5\' 8"  (1.727 m)    Body mass index is 27.14 kg/m.   General: The patient is well-developed and well-nourished and in no acute distress  Eyes:  Funduscopic exam shows normal optic discs and retinal vessels.  Neck: The neck is supple, no carotid bruits are noted.  The neck is  nontender.  Cardiovascular: The heart has a regular rate and rhythm with a normal S1 and S2. There were no murmurs, gallops or rubs.    Skin: Extremities are without significant edema.  Musculoskeletal:  Back is nontender  Neurologic Exam  Mental status: The patient is alert and oriented x 3 at the time of the examination. The patient has apparent normal recent and remote memory, with an apparently normal attention span and concentration ability.   Speech is normal.  Cranial nerves: Extraocular movements show mild diplopia to right (esp right and up gaze).   There is no nystagmus. Pupils are equal, round, and reactive to light and accomodation.  She reported symmetric colors. Visual fields are full.  Facial symmetry is present. There is good facial sensation to soft touch bilaterally.Facial strength is normal.  Trapezius and sternocleidomastoid strength is normal. No dysarthria is noted.   No obvious hearing deficits are noted.  Motor:  Muscle bulk is normal.   Tone is increased in Left > right leg. Strength is  5 / 5 in all 4 extremities.   Sensory: Sensory testing is intact to pinprick, soft touch and vibration sensation in all 4 extremities.  Coordination: Cerebellar testing reveals good finger-nose-finger and heel-to-shin bilaterally.  Gait and station: Station is normal.   Gait is mildly spastic (left > right).   Tandem gait is wide. Romberg is negative.   Reflexes: Deep tendon reflexes are symmetric and brisk bilaterally.        DIAGNOSTIC DATA (LABS, IMAGING, TESTING) - I reviewed patient records, labs, notes, testing and imaging myself where available.     ASSESSMENT AND PLAN  Multiple sclerosis (HCC)  Epilepsy, generalized, convulsive (HCC)  Other fatigue  Diplopia  Gait disturbance  Obesity     1.   Continue Tecfidera for MS as her DMT. We will check a CBC with differential today to make sure that there is not severe lymphopenia. If present, we will need to  switch to a different medication. 2.   She has appointment with ophthalmologist for her diplopia.  3.   Continue on her current dose of Lamictal  and Keppra (brand necessary as she had a severe breakthrough seizure while on generic requiring intubation).   She is advised to get a good night rest daily. 4.  Continue Provigil for MS related fatigue and sleepiness.  Add phentermine which may help her fatigue as well as help weight loss for obesity.   5.   She will continue to stay active and exercises as tolerated. 6.  If footdrop becomes more of a problem, consider an AFO.  She will return to see me in 6 months or sooner if she has new or worsening neurologic symptoms.  40 minute face-to-face interaction with greater than one half of the time counseling or coordinating care about her MS and related symptoms.  Keryn Nessler A. Epimenio Foot, MD, PhD 05/02/2016, 11:17 AM Certified in Neurology, Clinical Neurophysiology, Sleep Medicine, Pain Medicine and Neuroimaging  [facility] [address] [place], [place] [address] [phone]

## 2016-05-03 ENCOUNTER — Telehealth: Payer: Self-pay | Admitting: *Deleted

## 2016-05-03 LAB — CBC WITH DIFFERENTIAL/PLATELET
BASOS: 1 %
Basophils Absolute: 0.1 10*3/uL (ref 0.0–0.2)
EOS (ABSOLUTE): 0.1 10*3/uL (ref 0.0–0.4)
EOS: 3 %
HEMATOCRIT: 39.5 % (ref 34.0–46.6)
HEMOGLOBIN: 13.2 g/dL (ref 11.1–15.9)
IMMATURE GRANS (ABS): 0 10*3/uL (ref 0.0–0.1)
Immature Granulocytes: 0 %
LYMPHS ABS: 1 10*3/uL (ref 0.7–3.1)
LYMPHS: 30 %
MCH: 30.6 pg (ref 26.6–33.0)
MCHC: 33.4 g/dL (ref 31.5–35.7)
MCV: 91 fL (ref 79–97)
MONOCYTES: 8 %
Monocytes Absolute: 0.3 10*3/uL (ref 0.1–0.9)
NEUTROS ABS: 2.1 10*3/uL (ref 1.4–7.0)
Neutrophils: 58 %
Platelets: 252 10*3/uL (ref 150–379)
RBC: 4.32 x10E6/uL (ref 3.77–5.28)
RDW: 13.5 % (ref 12.3–15.4)
WBC: 3.5 10*3/uL (ref 3.4–10.8)

## 2016-05-03 NOTE — Telephone Encounter (Signed)
-----   Message from Asa Lenteichard A Sater, MD sent at 05/03/2016 10:30 AM EDT ----- Please let her know that the lab work was normal.

## 2016-05-03 NOTE — Telephone Encounter (Signed)
LMOM that per RAS, labs done in our office yesterday were ok.  She does not need to return this call unless she has questions/fim

## 2016-07-12 ENCOUNTER — Telehealth: Payer: Self-pay | Admitting: *Deleted

## 2016-07-12 MED ORDER — IBUPROFEN 800 MG PO TABS: 800.0000 mg | ORAL_TABLET | Freq: Three times a day (TID) | ORAL | 1 refills | Status: DC | PRN

## 2016-07-12 NOTE — Telephone Encounter (Signed)
Ibuprofen escribed to [facility] per faxed request/fim

## 2016-10-31 ENCOUNTER — Ambulatory Visit (INDEPENDENT_AMBULATORY_CARE_PROVIDER_SITE_OTHER): Payer: BLUE CROSS/BLUE SHIELD | Admitting: Neurology

## 2016-10-31 ENCOUNTER — Encounter: Payer: Self-pay | Admitting: Neurology

## 2016-10-31 ENCOUNTER — Telehealth: Payer: Self-pay | Admitting: Neurology

## 2016-10-31 VITALS — BP 119/86 | HR 94 | Resp 16 | Ht 66.0 in | Wt 167.5 lb

## 2016-10-31 DIAGNOSIS — G40309 Generalized idiopathic epilepsy and epileptic syndromes, not intractable, without status epilepticus: Secondary | ICD-10-CM | POA: Diagnosis not present

## 2016-10-31 DIAGNOSIS — R5383 Other fatigue: Secondary | ICD-10-CM

## 2016-10-31 DIAGNOSIS — R269 Unspecified abnormalities of gait and mobility: Secondary | ICD-10-CM

## 2016-10-31 DIAGNOSIS — R3911 Hesitancy of micturition: Secondary | ICD-10-CM

## 2016-10-31 DIAGNOSIS — N3000 Acute cystitis without hematuria: Secondary | ICD-10-CM

## 2016-10-31 DIAGNOSIS — G35 Multiple sclerosis: Secondary | ICD-10-CM | POA: Diagnosis not present

## 2016-10-31 MED ORDER — SULFAMETHOXAZOLE-TRIMETHOPRIM 800-160 MG PO TABS: 1.0000 | ORAL_TABLET | Freq: Two times a day (BID) | ORAL | 0 refills | Status: DC

## 2016-10-31 MED ORDER — VITAMIN D (ERGOCALCIFEROL) 1.25 MG (50000 UNIT) PO CAPS: 50000.0000 [IU] | ORAL_CAPSULE | ORAL | 3 refills | Status: DC

## 2016-10-31 MED ORDER — PHENTERMINE HCL 37.5 MG PO CAPS: 37.5000 mg | ORAL_CAPSULE | ORAL | 5 refills | Status: DC

## 2016-10-31 MED ORDER — MODAFINIL 200 MG PO TABS: 200.0000 mg | ORAL_TABLET | Freq: Two times a day (BID) | ORAL | 1 refills | Status: DC

## 2016-10-31 NOTE — Telephone Encounter (Signed)
Patient calling for face to face appointment to get power wheelchair.

## 2016-10-31 NOTE — Addendum Note (Signed)
Addended by: Despina Arias A on: 10/31/2016 05:49 PM   Modules accepted: Level of Service

## 2016-10-31 NOTE — Progress Notes (Addendum)
[facility]  PATIENT: Ruth Anderson DOB: [birth year]  REFERRING DOCTOR OR PCP:  none SOURCE: patient, husband and records from [facility] and images from [facility].  _________________________________   HISTORICAL  CHIEF COMPLAINT:  Chief Complaint  Patient presents with  . Multiple Sclerosis    Sts. she  continues to tolerate Tecfidera well.  Feels fatigue is some worse in the afternoons.  Denies sz. activity since last ov. Sts. is compliant with Keppra and Lamictal./fim  . Seizures    HISTORY OF PRESENT ILLNESS:  Ruth Anderson is a 46 year old woman with multiple sclerosis and epilepsy.    She is here today primarily for evaluation of her mobility needs.  Mobility needs: Due to reduced ambulation and fatigue, she has had difficulties with her activities of daily living. Specifically, when her fatigue is worse in the afternoons, she has difficulty ambulating from room to room activities such as going to the bathroom, personal hygiene, and household chores such as meal preparation and laundry.     Additionally, if she has a seizure she is weak in her arms or legs and much more fatigued for several days afterwards. Due to her fatigue, and weakness in the arms, she is unable to use a cane or walker or to self propel a regular or light weight wheelchair.. Therefore, she would need a powered vehicle. A scooter would not meet her mobility needs due to the larger turning radius (would not work in many rooms of her house) and lack of back support. Therefore, she needs an Mining engineer wheelchair. An electric wheelchair will allow her to complete her activities of daily living.   She has the ability to safely operate an Mining engineer wheelchair  MS:  She is on Tecfidera and tolerates it well.  She denies any actual exacerbation but has noted more numbness of fingers in both hands.    I personally reviewed the MRI of the brain dated 11/04/2015 and compared to  the MRI  from 11/25/2012. There is no definite change in the interim  Gait/strength/sensation:    She feels gait is baseline with frequent stumbles and occasional falls. She has much more difficulty with gait in the afternoons when her fatigue is worse.  She notes left > right leg weakness with foot drop, worse when tired. She notes mild proximal left arm weakness. She has numbness in her hands.    No dysesthetic pain.  Bladder:   She has some urinary urgency but no incontinence.    She has nocturia.   She also has more hesitancy and does not always empty.   She has had frequent UTI's and used ot be on Macrobid with benefit.     She thinks she may be having a UTI.   She denies any issues with bowel.      She prefers not to take any more med's.     Vision: She feels vision is slightly worse in both eyes but her glasses are old.   She plans on seeing ophtho soon.   She notes some diplopia with gaze to the right.       Diplopia is worse when she is tired.     Fatigue/sleep: Fatigue is her main problem most days.     She has issues with both mental and physical fatigue.   Her fatigue did better on the Tysabri than on Tecfidera. Provigil and phentermine helps the fatigue some. She feels the quality of her sleep is ok.  Mood/cognition:   She denies major depression or anxiety.     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    She notes problems with attention, memory, processing speed and word finding.   Provigil helps some.   In the past, she was on Ritalin with benefit and better attention/focus.     Seizure:    Her last seizure was in 2014 and she needed intubation.   Her first seizure was in 1998 and they have been triggered by flashing Christmas tree lights. She has had about a dozen seizures overall with most of them occurring out of sleep but with a couple of them occurring during the daytime with severe ictal grogginess. For the most part, the seizures have been controlled on  Lamictal 200 mg 3 times a day and Keppra 1500 mg twice a day.    Of note, she had breakthrough seizure on generic so we have been writing for the brand.      MS History:  She was diagnosed with MS in 1988 placed on Betaseron when it became available.  Later on she switched to Copaxone and Avonex. When Tysabri became available in 2006, she switched and had 70 infusions over the next 6  years (monthly except for a 6 month holiday).   She tolerated Tysabri well, was stable and was JCV antibody negative. However, she did not like having to do the monthly to hour infusions. In May 2013, she started Tecfidera. She had flushing that persisted after many months but did not have significant GI issues. I last saw her on 07/29/2014 and she reported that her MS remains stable on the Tecfidera. I personally reviewed her MRI of the brain from 2015. It was compared to an MRI dated 12/12/2009. She has signal matter signal changes in the hemispheres consistent with multiple sclerosis. Many of these are radially oriented to the lateral ventricles in the corpus callosum and others are white matter. Brain stem appears normal. There were no acute findings and there was no significant changes when compared to the MRI dated 12/12/2009. Vitamin D was 32.9 in July 2015. Lymphocyte count was 0.8 on 07/29/2014.   REVIEW OF SYSTEMS: Constitutional: No fevers, chills, sweats, or change in appetite.  Notes some fatigue Eyes: as above.  No eye pain Ear, nose and throat: No hearing loss, ear pain, nasal congestion, sore throat Cardiovascular: No chest pain, palpitations Respiratory: No shortness of breath at rest or with exertion.   No wheezes GastrointestinaI: No nausea, vomiting, diarrhea, abdominal pain, fecal incontinence Genitourinary: No dysuria, urinary retention or frequency.  No nocturia. Musculoskeletal: No neck pain, back pain Integumentary: No rash, pruritus, skin lesions Neurological: as above Psychiatric: No  depression at this time.  No anxiety Endocrine: No palpitations, diaphoresis, change in appetite, change in weigh or increased thirst Hematologic/Lymphatic: No anemia, purpura, petechiae. Allergic/Immunologic: No itchy/runny eyes, nasal congestion, recent allergic reactions, rashes  ALLERGIES: No Known Allergies  HOME MEDICATIONS:  Current Outpatient Prescriptions:  .  azithromycin (ZITHROMAX Z-PAK) 250 MG tablet, Take one tablet daily until gone.  May give generic., Disp: 6 each, Rfl: 0 .  Dimethyl Fumarate 240 MG CPDR, Take 1 capsule (240 mg total) by mouth 2 (two) times daily., Disp: 180 capsule, Rfl: 3 .  ibuprofen (ADVIL,MOTRIN) 800 MG tablet, Take 1 tablet (800 mg total) by mouth 3 (three) times daily as needed., Disp: 270 tablet, Rfl: 1 .  LAMICTAL 200 MG tablet, Take 1 tablet (200 mg  total) by mouth 3 (three) times daily. BRAND NAME LAMICTAL FOR SEIZURE DISORDER, Disp: 270 tablet, Rfl: 3 .  levETIRAcetam (KEPPRA) 750 MG tablet, Take 2 tablets (1,500 mg total) by mouth 2 (two) times daily., Disp: 360 tablet, Rfl: 3 .  modafinil (PROVIGIL) 200 MG tablet, Take 1 tablet (200 mg total) by mouth 2 (two) times daily., Disp: 180 tablet, Rfl: 1 .  nitrofurantoin, macrocrystal-monohydrate, (MACROBID) 100 MG capsule, Take 1 capsule (100 mg total) by mouth 2 (two) times daily., Disp: 180 capsule, Rfl: 3 .  phentermine 37.5 MG capsule, Take 1 capsule (37.5 mg total) by mouth every morning., Disp: 30 capsule, Rfl: 5 .  Vitamin D, Ergocalciferol, (DRISDOL) 50000 units CAPS capsule, Take 1 capsule (50,000 Units total) by mouth every 7 (seven) days., Disp: 12 capsule, Rfl: 3 .  sulfamethoxazole-trimethoprim (BACTRIM DS,SEPTRA DS) 800-160 MG tablet, Take 1 tablet by mouth 2 (two) times daily., Disp: 10 tablet, Rfl: 0 No current facility-administered medications for this visit.   Facility-Administered Medications Ordered in Other Visits:  .  gadopentetate dimeglumine (MAGNEVIST) injection 17 mL, 17 mL,  Intravenous, Once PRN, Asa Lente, MD  PAST MEDICAL HISTORY: Past Medical History:  Diagnosis Date  . Multiple sclerosis (HCC)   . Seizures (HCC)   . Vision abnormalities     PAST SURGICAL HISTORY: No past surgical history on file.  FAMILY HISTORY: Family History  Problem Relation Age of Onset  . Hypertension Mother   . Heart disease Father   . Hypertension Father   . Stroke Father   . Diabetes type II Father     SOCIAL HISTORY:  Social History   Social History  . Marital status: Married    Spouse name: N/A  . Number of children: N/A  . Years of education: N/A   Occupational History  . Not on file.   Social History Main Topics  . Smoking status: Former Games developer  . Smokeless tobacco: Never Used  . Alcohol use No  . Drug use: No  . Sexual activity: Not on file   Other Topics Concern  . Not on file   Social History Narrative  . No narrative on file     PHYSICAL EXAM  Vitals:   10/31/16 0919  BP: 119/86  Pulse: 94  Resp: 16  Weight: 167 lb 8 oz (76 kg)  Height: 5\' 6"  (1.676 m)    Body mass index is 27.04 kg/m.   General: The patient is well-developed and well-nourished and in no acute distress  Eyes:  Funduscopic exam shows normal optic discs and retinal vessels.   Neurologic Exam  Mental status: The patient is alert and oriented x 3 at the time of the examination. The patient has apparent normal recent and remote memory, with an apparently normal attention span and concentration ability.   Speech is normal.  Cranial nerves: Extraocular movements show mild diplopia to right (esp right and up gaze).   There is no nystagmus. Pupils are equal, round, and reactive to light and accomodation.  She reported symmetric colors. Facial strength and sensation are normal.    Trapezius and sternocleidomastoid strength is normal. No dysarthria is noted.   No obvious hearing deficits are noted.  Motor:  Muscle bulk is normal.   Tone is increased in Left >  right leg. Strength is  5 / 5 in all 4 extremities.   Sensory:   She has intact touch and vibration sensationin all 4 extremities.  Coordination: Cerebellar testing reveals good finger-nose-finger  and heel-to-shin bilaterally.  Gait and station: Station is normal.   Gait is mildly spastic (left > right) and wide.   Tandem gait is wide. Romberg is negative.   Reflexes: Deep tendon reflexes are symmetric and brisk bilaterally.        DIAGNOSTIC DATA (LABS, IMAGING, TESTING) - I reviewed patient records, labs, notes, testing and imaging myself where available.     ASSESSMENT AND PLAN  Multiple sclerosis (HCC) - Plan: CBC with Differential/Platelet, Comprehensive metabolic panel, Culture, Urine, Urinalysis, Routine w reflex microscopic  Epilepsy, generalized, convulsive (HCC)  Gait disturbance  Other fatigue  Urinary hesitancy - Plan: Culture, Urine, Urinalysis, Routine w reflex microscopic  Acute cystitis without hematuria - Plan: Culture, Urine, Urinalysis, Routine w reflex microscopic    1.   Due to difficulties with her mobility, Emine Lopata needs a power wheelchair to help complete activities of daily living.  2.   Continue Tecfidera for MS as her DMT. We will check a CBC with differential today to make sure that there is not severe lymphopenia and CMP to check for hepatotoxicity. If present, we will need to switch to a different medication. 3.   She appears to have a possible urinary tract infection and we will check a UA and culture. I will call in Bactrim  4.   Continue on her current dose of Lamictal and Keppra (brand necessary as she had a severe breakthrough seizure while on generic requiring intubation).   She is advised to get a good night rest daily.  .  Continue Provigil and phentermine for MS-related fatigue and sleepiness..  The combination has worked better for her.  5.   She will continue to stay active and exercises as tolerated. 6.  She will return to see me in  6 months or sooner if she has new or worsening neurologic symptoms.  Richard A. Epimenio Foot, MD, PhD 10/31/2016, 10:04 AM Certified in Neurology, Clinical Neurophysiology, Sleep Medicine, Pain Medicine and Neuroimaging  [facility] [address] [place], [place] [address] [phone]

## 2016-11-01 LAB — CBC WITH DIFFERENTIAL/PLATELET
BASOS ABS: 0.1 10*3/uL (ref 0.0–0.2)
Basos: 1 %
EOS (ABSOLUTE): 0.2 10*3/uL (ref 0.0–0.4)
EOS: 2 %
HEMATOCRIT: 39.3 % (ref 34.0–46.6)
Hemoglobin: 13.3 g/dL (ref 11.1–15.9)
IMMATURE GRANULOCYTES: 0 %
Immature Grans (Abs): 0 10*3/uL (ref 0.0–0.1)
Lymphocytes Absolute: 1.3 10*3/uL (ref 0.7–3.1)
Lymphs: 18 %
MCH: 30.3 pg (ref 26.6–33.0)
MCHC: 33.8 g/dL (ref 31.5–35.7)
MCV: 90 fL (ref 79–97)
MONOCYTES: 10 %
MONOS ABS: 0.7 10*3/uL (ref 0.1–0.9)
NEUTROS PCT: 69 %
Neutrophils Absolute: 4.8 10*3/uL (ref 1.4–7.0)
Platelets: 260 10*3/uL (ref 150–379)
RBC: 4.39 x10E6/uL (ref 3.77–5.28)
RDW: 12.6 % (ref 12.3–15.4)
WBC: 7.1 10*3/uL (ref 3.4–10.8)

## 2016-11-01 LAB — COMPREHENSIVE METABOLIC PANEL
ALK PHOS: 117 IU/L (ref 39–117)
ALT: 31 IU/L (ref 0–32)
AST: 16 IU/L (ref 0–40)
Albumin/Globulin Ratio: 1.9 (ref 1.2–2.2)
Albumin: 4.5 g/dL (ref 3.5–5.5)
BUN/Creatinine Ratio: 15 (ref 9–23)
BUN: 11 mg/dL (ref 6–24)
Bilirubin Total: 0.7 mg/dL (ref 0.0–1.2)
CALCIUM: 9.9 mg/dL (ref 8.7–10.2)
CO2: 28 mmol/L (ref 18–29)
CREATININE: 0.74 mg/dL (ref 0.57–1.00)
Chloride: 101 mmol/L (ref 96–106)
GFR calc Af Amer: 112 mL/min/{1.73_m2} (ref >59)
GFR, EST NON AFRICAN AMERICAN: 97 mL/min/{1.73_m2} (ref >59)
GLOBULIN, TOTAL: 2.4 g/dL (ref 1.5–4.5)
GLUCOSE: 94 mg/dL (ref 65–99)
Potassium: 5.4 mmol/L — ABNORMAL HIGH (ref 3.5–5.2)
SODIUM: 144 mmol/L (ref 134–144)
Total Protein: 6.9 g/dL (ref 6.0–8.5)

## 2016-11-01 LAB — URINALYSIS, ROUTINE W REFLEX MICROSCOPIC
BILIRUBIN UA: NEGATIVE
Glucose, UA: NEGATIVE
KETONES UA: NEGATIVE
NITRITE UA: NEGATIVE
PH UA: 5 (ref 5.0–7.5)
Protein, UA: NEGATIVE
RBC UA: NEGATIVE
SPEC GRAV UA: 1.02 (ref 1.005–1.030)
UUROB: 0.2 mg/dL (ref 0.2–1.0)

## 2016-11-01 LAB — MICROSCOPIC EXAMINATION: CASTS: NONE SEEN /LPF

## 2016-11-01 NOTE — Telephone Encounter (Signed)
I have spoken with pt.  She requested ov note to document difficulty with mobility.  Sts. she currently has a hoverround and is working with that co. to get a new one.  OV note mailed to her home address/fim

## 2016-11-02 LAB — URINE CULTURE

## 2016-11-02 NOTE — Telephone Encounter (Signed)
I have spoken with Ruth Anderson this morning.  Per her request, referral for power w/c/electric scooter eval faxed to [employer] fax# [phone]/fim

## 2016-11-02 NOTE — Telephone Encounter (Signed)
Patient is trying to receive a new power wheel chair with hoover round.  Are we able to refer patient to PT for mobility eval.  Please call

## 2016-11-04 ENCOUNTER — Telehealth: Payer: Self-pay | Admitting: Neurology

## 2016-11-04 NOTE — Telephone Encounter (Signed)
She called to let us know that she had a seizure last night (while asleep) with some shaking.  She has been tired all day.    She is compliant with med's but notes worsening sleep.    No UTI symptoms or fevers  Continue med's.  Advised to try to get > 7 hours sleep every night.   If not better, call us back tomorrow

## 2016-11-08 NOTE — Telephone Encounter (Signed)
I have spoken with Ruth Anderson this afternoon.  Sts. fatigue is some better, still present. No further sz. activity.  Is taking Phentermine and Modafinil as rx'd.  No other sx. of infection or exacerbation.  Will call back if she does not continue to improve, or if she develops new sx/fim

## 2016-11-08 NOTE — Telephone Encounter (Signed)
Pt called said she has not gotten over the seizure from last week. She is still very weak wants to sleep a lot. Please call to discuss

## 2016-12-17 ENCOUNTER — Telehealth: Payer: Self-pay | Admitting: Neurology

## 2016-12-17 NOTE — Telephone Encounter (Signed)
error 

## 2017-01-08 ENCOUNTER — Telehealth: Payer: Self-pay | Admitting: *Deleted

## 2017-01-08 ENCOUNTER — Telehealth: Payer: Self-pay | Admitting: Neurology

## 2017-01-08 MED ORDER — IBUPROFEN 800 MG PO TABS: 800.0000 mg | ORAL_TABLET | Freq: Three times a day (TID) | ORAL | 1 refills | Status: DC | PRN

## 2017-01-08 NOTE — Telephone Encounter (Signed)
Ibuprofen escribed to [facility] per faxed request/fim 

## 2017-01-09 NOTE — Telephone Encounter (Signed)
ERROR

## 2017-02-25 ENCOUNTER — Telehealth: Payer: Self-pay | Admitting: Neurology

## 2017-02-25 NOTE — Telephone Encounter (Signed)
Pt calling asking for a call back from RN Faith to discuss the management of a couple of her medications, please call

## 2017-02-25 NOTE — Telephone Encounter (Signed)
I have spoken with Ruth Anderson this morning.  She just wanted to confirm if RAS was doing esi's in this office, and I have advised we do not have x-ray  here, so he is not able to do these./fim

## 2017-03-22 ENCOUNTER — Telehealth: Payer: Self-pay | Admitting: *Deleted

## 2017-03-22 MED ORDER — LAMICTAL 200 MG PO TABS: 200.0000 mg | ORAL_TABLET | Freq: Three times a day (TID) | ORAL | 3 refills | Status: DC

## 2017-03-22 NOTE — Telephone Encounter (Signed)
Lamictal rx. escribed to [facility] per faxed request/fim

## 2017-03-27 ENCOUNTER — Encounter: Payer: Self-pay | Admitting: *Deleted

## 2017-03-27 ENCOUNTER — Telehealth: Payer: Self-pay | Admitting: *Deleted

## 2017-03-27 NOTE — Telephone Encounter (Signed)
PA for brand name Lamictal was completed but denied.  Pt. has breakthru sz. on generic med, so letter of appeal has been written and faxed to [facility] Clinical Appeals Dept., fax # [phone]/fim

## 2017-04-03 NOTE — Telephone Encounter (Signed)
Letter receive from [facility] from appeal request was approve. The dates are 03/13/2017 to 03/29/2018.

## 2017-04-10 NOTE — Telephone Encounter (Signed)
Case ID for appeal is [id number]. [facility] phone# [phone]/fim

## 2017-05-07 ENCOUNTER — Ambulatory Visit (INDEPENDENT_AMBULATORY_CARE_PROVIDER_SITE_OTHER): Payer: BLUE CROSS/BLUE SHIELD | Admitting: Neurology

## 2017-05-07 ENCOUNTER — Encounter: Payer: Self-pay | Admitting: Neurology

## 2017-05-07 VITALS — BP 125/82 | HR 93 | Resp 18 | Ht 66.0 in | Wt 167.0 lb

## 2017-05-07 DIAGNOSIS — R5383 Other fatigue: Secondary | ICD-10-CM | POA: Diagnosis not present

## 2017-05-07 DIAGNOSIS — G40309 Generalized idiopathic epilepsy and epileptic syndromes, not intractable, without status epilepticus: Secondary | ICD-10-CM | POA: Diagnosis not present

## 2017-05-07 DIAGNOSIS — R269 Unspecified abnormalities of gait and mobility: Secondary | ICD-10-CM

## 2017-05-07 DIAGNOSIS — H532 Diplopia: Secondary | ICD-10-CM

## 2017-05-07 DIAGNOSIS — R4189 Other symptoms and signs involving cognitive functions and awareness: Secondary | ICD-10-CM

## 2017-05-07 DIAGNOSIS — G35 Multiple sclerosis: Secondary | ICD-10-CM

## 2017-05-07 MED ORDER — MODAFINIL 200 MG PO TABS: 200.0000 mg | ORAL_TABLET | Freq: Two times a day (BID) | ORAL | 1 refills | Status: DC

## 2017-05-07 MED ORDER — PHENTERMINE HCL 37.5 MG PO CAPS: 37.5000 mg | ORAL_CAPSULE | ORAL | 5 refills | Status: DC

## 2017-05-07 NOTE — Progress Notes (Signed)
[facility]  PATIENT: Ruth Anderson DOB: [birth year]  REFERRING DOCTOR OR PCP:  none SOURCE: patient, husband and records from [facility] neurology and images from [facility].  _________________________________   HISTORICAL  CHIEF COMPLAINT:  Chief Complaint  Patient presents with  . Multiple Sclerosis    Sts. she has been out of Tecfidera for about a month.  Sts. she didn't receive shipment from the specialty pharmacy, has not called them to see why. Also did not let our office know.  Denies new or worsening sx/fim    HISTORY OF PRESENT ILLNESS:  Ruth Anderson is a 46 year old woman with multiple sclerosis and epilepsy.      MS:  She Was on Tecfidera but did not get her last order shipped to her. We discussed the importance of getting back on therapy. She does not think she had any exacerbations while she was on it and she tolerates it well.  In the past, I compare the MRI dated 11/04/2015 and to  the MRI from 11/25/2012. There was no definite change in the interim  Gait/strength/sensation:    Her gait is doing about the same. She stumbles but rarely has a fall. Generally her gait is worse when she is more tired or she is hot.  She notes left > right leg weakness with foot drop, worse when tired. She notes mild proximal left arm weakness. She has numbness in her hands.    No dysesthetic pain.  Bladder:   Bladder function is doing well. She has mild frequency and urgency but no incontinence. She had a urinary tract infection earlier this year..   She denies any issues with bowel.      She prefers not to take any more med's.     Vision: She notes mild vision changes that have been mostly stable this year. When she looks to the right she sometimes has double vision, especially when she is tired.   Fatigue/sleep: Fatigue is worse with heat and she gets tired quickly.   She has issues with both mental and physical fatigue.   Her fatigue did better on the  Tysabri than on Tecfidera. Provigil and phentermine helps the fatigue some. She feels the quality of her sleep is good most nights but she sometimes has trouble quieting her mind.    Mood/cognition:   She notes mild depression.   Her sister passed away (MI January 30, 2013) and she still sometimes feels down.  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    She notes problems with attention, memory, processing speed and word finding.   Provigil helps some.   In the past, she was on Ritalin with benefit and better attention/focus.     Seizure:    Her last seizure was in 2014, shortly after her sister died and she needed intubation.   Her first seizure was in 1998 and they have been triggered by flashing Christmas tree lights. She has had about a dozen seizures overall with most of them occurring out of sleep but with a couple of them occurring during the daytime with severe ictal grogginess. For the most part, the seizures have been controlled on Lamictal 200 mg 3 times a day and Keppra 1500 mg twice a day.    Of note, she had breakthrough seizure on generic so we have been writing for the brand.      MS History:  She was diagnosed with MS in 1988 placed on Betaseron when  it became available.  Later on she switched to Copaxone and Avonex. When Tysabri became available in 2006, she switched and had 70 infusions over the next 6  years (monthly except for a 6 month holiday).   She tolerated Tysabri well, was stable and was JCV antibody negative. However, she did not like having to do the monthly to hour infusions. In May 2013, she started Tecfidera. She had flushing that persisted after many months but did not have significant GI issues. I last saw her on 07/29/2014 and she reported that her MS remains stable on the Tecfidera. I personally reviewed her MRI of the brain from 2015. It was compared to an MRI dated 12/12/2009. She has signal matter signal changes in the hemispheres consistent with  multiple sclerosis. Many of these are radially oriented to the lateral ventricles in the corpus callosum and others are white matter. Brain stem appears normal. There were no acute findings and there was no significant changes when compared to the MRI dated 12/12/2009. Vitamin D was 32.9 in July 2015. Lymphocyte count was 0.8 on 07/29/2014.   REVIEW OF SYSTEMS: Constitutional: No fevers, chills, sweats, or change in appetite.  Notes some fatigue Eyes: as above.  No eye pain Ear, nose and throat: No hearing loss, ear pain, nasal congestion, sore throat Cardiovascular: No chest pain, palpitations Respiratory: No shortness of breath at rest or with exertion.   No wheezes GastrointestinaI: No nausea, vomiting, diarrhea, abdominal pain, fecal incontinence Genitourinary: No dysuria, urinary retention or frequency.  No nocturia. Musculoskeletal: No neck pain, back pain Integumentary: No rash, pruritus, skin lesions Neurological: as above Psychiatric: No depression at this time.  No anxiety Endocrine: No palpitations, diaphoresis, change in appetite, change in weigh or increased thirst Hematologic/Lymphatic: No anemia, purpura, petechiae. Allergic/Immunologic: No itchy/runny eyes, nasal congestion, recent allergic reactions, rashes  ALLERGIES: No Known Allergies  HOME MEDICATIONS:  Current Outpatient Prescriptions:  .  ibuprofen (ADVIL,MOTRIN) 800 MG tablet, Take 1 tablet (800 mg total) by mouth 3 (three) times daily as needed., Disp: 270 tablet, Rfl: 1 .  LAMICTAL 200 MG tablet, Take 1 tablet (200 mg total) by mouth 3 (three) times daily. BRAND NAME LAMICTAL FOR SEIZURE DISORDER, Disp: 270 tablet, Rfl: 3 .  levETIRAcetam (KEPPRA) 750 MG tablet, Take 2 tablets (1,500 mg total) by mouth 2 (two) times daily., Disp: 360 tablet, Rfl: 3 .  modafinil (PROVIGIL) 200 MG tablet, Take 1 tablet (200 mg total) by mouth 2 (two) times daily., Disp: 180 tablet, Rfl: 1 .  phentermine 37.5 MG capsule, Take 1  capsule (37.5 mg total) by mouth every morning., Disp: 30 capsule, Rfl: 5 .  Vitamin D, Ergocalciferol, (DRISDOL) 50000 units CAPS capsule, Take 1 capsule (50,000 Units total) by mouth every 7 (seven) days., Disp: 12 capsule, Rfl: 3 .  Dimethyl Fumarate 240 MG CPDR, Take 1 capsule (240 mg total) by mouth 2 (two) times daily. (Patient not taking: Reported on 05/07/2017), Disp: 180 capsule, Rfl: 3 No current facility-administered medications for this visit.   Facility-Administered Medications Ordered in Other Visits:  .  gadopentetate dimeglumine (MAGNEVIST) injection 17 mL, 17 mL, Intravenous, Once PRN, Esequiel Kleinfelter, Pearletha Furl, MD  PAST MEDICAL HISTORY: Past Medical History:  Diagnosis Date  . Multiple sclerosis (HCC)   . Seizures (HCC)   . Vision abnormalities     PAST SURGICAL HISTORY: No past surgical history on file.  FAMILY HISTORY: Family History  Problem Relation Age of Onset  . Hypertension Mother   . Heart  disease Father   . Hypertension Father   . Stroke Father   . Diabetes type II Father     SOCIAL HISTORY:  Social History   Social History  . Marital status: Married    Spouse name: N/A  . Number of children: N/A  . Years of education: N/A   Occupational History  . Not on file.   Social History Main Topics  . Smoking status: Former Games developer  . Smokeless tobacco: Never Used  . Alcohol use No  . Drug use: No  . Sexual activity: Not on file   Other Topics Concern  . Not on file   Social History Narrative  . No narrative on file     PHYSICAL EXAM  Vitals:   05/07/17 1437  BP: 125/82  Pulse: 93  Resp: 18  Weight: 167 lb (75.8 kg)  Height: 5\' 6"  (1.676 m)    Body mass index is 26.95 kg/m.   General: The patient is well-developed and well-nourished and in no acute distress  Eyes:  Funduscopic exam shows normal optic discs and retinal vessels.   Neurologic Exam  Mental status: The patient is alert and oriented x 3 at the time of the examination.  The patient has apparent normal recent and remote memory, with an apparently normal attention span and concentration ability.   Speech is normal.  Cranial nerves: Extraocular movements show mild diplopia to right (esp right and up gaze). There is no nystagmus. The pupils are equal, round and reactive to light and accommodation. Color vision is symmetric. Visual acuity is symmetric. Facial strength and sensation are normal. Trapezius strength is normal..   No obvious hearing deficits are noted.  Motor:  Muscle bulk is normal.   Tone is mildly increased in the legs, left greater than right. Strength is  5 / 5 in all 4 extremities.   Sensory:   She has intact touch and vibration sensationin all 4 extremities.  Coordination: Cerebellar testing reveals good finger-nose-finger and heel-to-shin bilaterally.  Gait and station: Station is normal.   Gait is mildly spastic (left > right) and wide.  The tandem gait is wide. Romberg is negative.  Reflexes: Deep tendon reflexes are symmetric and brisk bilaterally.        DIAGNOSTIC DATA (LABS, IMAGING, TESTING) - I reviewed patient records, labs, notes, testing and imaging myself where available.     ASSESSMENT AND PLAN  Multiple sclerosis (HCC)  Epilepsy, generalized, convulsive (HCC)  Other fatigue  Diplopia  Gait disturbance  Disturbed cognition    1.  She will call [facility] to get back on her Tecfidera. I asked her to call us if she is unable to get the medication this week.  2.  she will continue the current dose of Lamictal and Keppra   (brand necessary as she had a severe breakthrough seizure while on generic requiring intubation).   She is advised to get a good night rest daily.  .  3.  Continue Provigil and phentermine for MS-related fatigue and sleepiness..  The combination has worked better for her.  4.   She will continue to stay active and exercises as tolerated. 5  She will return to see me in 6 months will sooner if she has new  or worsening neurologic symptoms.  Jailee Jaquez A. Epimenio Foot, MD, PhD 05/07/2017, 5:04 PM Certified in Neurology, Clinical Neurophysiology, Sleep Medicine, Pain Medicine and Neuroimaging  [facility] [address] [place], [place] [address] [phone]

## 2017-05-09 ENCOUNTER — Telehealth: Payer: Self-pay | Admitting: Neurology

## 2017-05-09 MED ORDER — DIMETHYL FUMARATE 120 & 240 MG PO MISC: 120.0000 mg | Freq: Two times a day (BID) | ORAL | 0 refills | Status: DC

## 2017-05-09 MED ORDER — DIMETHYL FUMARATE 240 MG PO CPDR: 240.0000 mg | DELAYED_RELEASE_CAPSULE | Freq: Two times a day (BID) | ORAL | 3 refills | Status: DC

## 2017-05-09 NOTE — Telephone Encounter (Signed)
Spoke with Ruth Anderson.  She sts. sp. pharm. needs new rx. for Tecfidera.  Rx. for Tecfidera and for starter pack, as she's had a several month lapse in tx., escribed to [facility]/fim

## 2017-05-09 NOTE — Telephone Encounter (Signed)
Pt request refill for tecfidera. Please call to discuss, she was unsure of the medication name

## 2017-05-13 NOTE — Telephone Encounter (Signed)
Alcario Drought with [facility] is calling to get clarification on Rx Tecfidera. ZOX#[id number],

## 2017-05-13 NOTE — Telephone Encounter (Signed)
I spoke with [facility] and clarified instructions for Tecfidera starter pack are 120mg  po bid for one wk, then 240mg  po bid after that. (Pt. is restarting Tecfidera after a several month lapse in therapy)/fim

## 2017-05-14 ENCOUNTER — Telehealth: Payer: Self-pay | Admitting: Neurology

## 2017-05-14 DIAGNOSIS — M21372 Foot drop, left foot: Secondary | ICD-10-CM | POA: Insufficient documentation

## 2017-05-14 NOTE — Telephone Encounter (Signed)
Spoke with Ruth Anderson.  She saw RAS on 05/07/17. He noted gait is worse when Ruth Anderson is tired or hot. , does have drop foot. Left leg weaker than right . She would like to try an AFO. Order faxed to [facility]./fim

## 2017-05-14 NOTE — Telephone Encounter (Signed)
Pt called in she said her rt drop foot seems to be getting worse over the past 4 months. Please call to discuss

## 2017-05-15 ENCOUNTER — Telehealth: Payer: Self-pay | Admitting: Neurology

## 2017-05-15 NOTE — Telephone Encounter (Signed)
Ruth Anderson with [facility] calling to discuss Tecfidera 30 day starter pack. Patient's plan does not cover medication. [id number].

## 2017-05-15 NOTE — Telephone Encounter (Signed)
PA for Tecfidera needed.  Pharmacist will fax PA form and I will complete it once it arrives/fim

## 2017-05-21 ENCOUNTER — Telehealth: Payer: Self-pay | Admitting: *Deleted

## 2017-05-21 NOTE — Telephone Encounter (Signed)
Tecfidera PA completed and faxed to [facility], fax# [phone]./fim

## 2017-05-27 ENCOUNTER — Telehealth: Payer: Self-pay | Admitting: Neurology

## 2017-05-27 MED ORDER — LEVETIRACETAM 750 MG PO TABS: 1500.0000 mg | ORAL_TABLET | Freq: Two times a day (BID) | ORAL | 3 refills | Status: DC

## 2017-05-27 NOTE — Telephone Encounter (Signed)
Patient called office requesting refill for levETIRAcetam (KEPPRA) 750 MG tablet.  Pharmacy- [facility] (having trouble with mail in order).

## 2017-05-27 NOTE — Telephone Encounter (Signed)
Keppra escribed to [facility] per pt's request/fim

## 2017-05-27 NOTE — Addendum Note (Signed)
Addended by: Candis Schatz I on: 05/27/2017 02:15 PM   Modules accepted: Orders

## 2017-05-28 ENCOUNTER — Telehealth: Payer: Self-pay | Admitting: *Deleted

## 2017-05-28 NOTE — Telephone Encounter (Signed)
Per Cover My Meds, PA for brand name Keppra is approved.  PA# [id number].  Case ID: [id number].  Dates of approval: 04/28/17-05/27/18./fim

## 2017-05-28 NOTE — Telephone Encounter (Signed)
Keppra PA completed via Cover My Meds (Key# [id number]).  Brand name Keppra is medically necessary as pt. has had breakthru sz. on the generic (Levetiracetum.)  Last sz. was in 2014 and was severe--pt. required intubation/fim

## 2017-07-01 ENCOUNTER — Telehealth: Payer: Self-pay | Admitting: *Deleted

## 2017-07-01 MED ORDER — LEVETIRACETAM 750 MG PO TABS: 1500.0000 mg | ORAL_TABLET | Freq: Two times a day (BID) | ORAL | 3 refills | Status: DC

## 2017-07-01 NOTE — Telephone Encounter (Signed)
Keppra escribed to [facility] per faxed request/fim

## 2017-07-22 ENCOUNTER — Telehealth: Payer: Self-pay | Admitting: *Deleted

## 2017-07-22 MED ORDER — IBUPROFEN 800 MG PO TABS: 800.0000 mg | ORAL_TABLET | Freq: Three times a day (TID) | ORAL | 1 refills | Status: DC | PRN

## 2017-07-22 NOTE — Telephone Encounter (Signed)
Ibuprofen escribed to [facility] per faxed request/fim 

## 2017-08-01 ENCOUNTER — Telehealth: Payer: Self-pay | Admitting: *Deleted

## 2017-08-01 NOTE — Telephone Encounter (Signed)
Spoke with Ruth Anderson this am.  She fell yesterday--believes she tripped over something.  No injury.  Today, sts. legs have given way twice and she has fallen.  Again no injury.  Denies other sx. that may indicate an exacerbation.  Denies uti sx. or other sx. of possible infection.  Does not feel she needs to be seen today. Has a power w/c and will use that.  If she continues to have more difficulty with legs, will call back for appt.  Last MRI was March 2017, so if sx. continue, will ask RAS if he wants to repeat MRI's/fim

## 2017-08-06 ENCOUNTER — Telehealth: Payer: Self-pay | Admitting: Neurology

## 2017-08-06 NOTE — Telephone Encounter (Signed)
I called the patient.  The patient fell last week, has had ongoing back pain without radiation down the legs.  She has not had any x-rays on her back.  She currently is not taking anything for the discomfort, she calls to get a pain medication.  She has 800 mg ibuprofen tablets at home, I have indicated that she is to start taking 1 tablet 3 times a day.

## 2017-08-07 MED ORDER — METHYLPREDNISOLONE 4 MG PO TBPK: ORAL_TABLET | ORAL | 0 refills | Status: DC

## 2017-08-07 NOTE — Telephone Encounter (Signed)
Spoke with Ruth Anderson.  She sts. back pain is improving.  No longer radiating into legs, but she would still like a medrol dose pack.  Rx. escribed to [facility] per her request/fim

## 2017-08-07 NOTE — Telephone Encounter (Signed)
Please check how she is doing. If not better, call in a steroid pack. If still not better next week,  I should see her next week.

## 2017-08-07 NOTE — Telephone Encounter (Signed)
Patient is returning your call.  

## 2017-08-07 NOTE — Telephone Encounter (Signed)
Left message with husband for pt. to call with update on back/leg pain/fim

## 2017-08-07 NOTE — Addendum Note (Signed)
Addended by: Candis Schatz I on: 08/07/2017 02:10 PM   Modules accepted: Orders

## 2017-09-25 ENCOUNTER — Telehealth: Payer: Self-pay

## 2017-09-25 NOTE — Telephone Encounter (Signed)
We received a prior authorization request for this medication. I have completed and submitted the PA on Cover My Meds and should have a determination within 48-72 hours.  Cover My Meds Key: [id number]   BPJPET:[id number];HKUVJD:[id number];Review Type:Prior Auth;Coverage Start Date:08/26/2017;Coverage End Date:09/25/2018

## 2017-10-01 ENCOUNTER — Telehealth: Payer: Self-pay | Admitting: Neurology

## 2017-10-01 MED ORDER — MODAFINIL 200 MG PO TABS: 200.0000 mg | ORAL_TABLET | Freq: Two times a day (BID) | ORAL | 1 refills | Status: DC

## 2017-10-01 NOTE — Telephone Encounter (Signed)
Pt requesting a refill for modafinil (PROVIGIL) 200 MG tablet sent to [facility], [place] - [address]

## 2017-10-01 NOTE — Telephone Encounter (Signed)
Modafinil rx. faxed to [facility]/fim

## 2017-10-01 NOTE — Telephone Encounter (Signed)
Rx. awaiting RAS sig/fim 

## 2017-10-01 NOTE — Telephone Encounter (Signed)
Pt called stating that she needs the brand name for levETIRAcetam (KEPPRA) 750 MG tablet and methylPREDNISolone (MEDROL DOSEPAK) 4 MG TBPK tablet [facility]. Pt has case number for Methylprednisolone [id number] Keppra [id number] faxed to [phone]

## 2017-10-29 ENCOUNTER — Other Ambulatory Visit: Payer: Self-pay | Admitting: Neurology

## 2017-11-04 ENCOUNTER — Other Ambulatory Visit: Payer: Self-pay | Admitting: Neurology

## 2017-11-05 ENCOUNTER — Encounter: Payer: Self-pay | Admitting: Neurology

## 2017-11-05 ENCOUNTER — Ambulatory Visit: Payer: BLUE CROSS/BLUE SHIELD | Admitting: Neurology

## 2017-11-05 ENCOUNTER — Other Ambulatory Visit: Payer: Self-pay

## 2017-11-05 VITALS — BP 124/82 | HR 79 | Resp 18 | Ht 66.0 in | Wt 167.0 lb

## 2017-11-05 DIAGNOSIS — R4189 Other symptoms and signs involving cognitive functions and awareness: Secondary | ICD-10-CM | POA: Diagnosis not present

## 2017-11-05 DIAGNOSIS — G40309 Generalized idiopathic epilepsy and epileptic syndromes, not intractable, without status epilepticus: Secondary | ICD-10-CM

## 2017-11-05 DIAGNOSIS — R269 Unspecified abnormalities of gait and mobility: Secondary | ICD-10-CM | POA: Diagnosis not present

## 2017-11-05 DIAGNOSIS — M21372 Foot drop, left foot: Secondary | ICD-10-CM

## 2017-11-05 DIAGNOSIS — E559 Vitamin D deficiency, unspecified: Secondary | ICD-10-CM | POA: Diagnosis not present

## 2017-11-05 DIAGNOSIS — G35 Multiple sclerosis: Secondary | ICD-10-CM | POA: Diagnosis not present

## 2017-11-05 MED ORDER — LEVETIRACETAM 750 MG PO TABS
1500.0000 mg | ORAL_TABLET | Freq: Two times a day (BID) | ORAL | 3 refills | Status: DC
Start: 2017-11-05 — End: 2018-03-11

## 2017-11-05 MED ORDER — LAMICTAL 200 MG PO TABS: 200.0000 mg | ORAL_TABLET | Freq: Three times a day (TID) | ORAL | 3 refills | Status: DC

## 2017-11-05 MED ORDER — PHENTERMINE HCL 37.5 MG PO CAPS: 37.5000 mg | ORAL_CAPSULE | ORAL | 5 refills | Status: DC

## 2017-11-05 NOTE — Progress Notes (Signed)
[facility]  PATIENT: Ruth Anderson DOB: [birth year]  REFERRING DOCTOR OR PCP:  none SOURCE: patient, husband and records from [facility] neurology and images from [facility].  _________________________________   HISTORICAL  CHIEF COMPLAINT:  Chief Complaint  Patient presents with  . Multiple Sclerosis    Sts. she continues to tolerate Tecfidera well.  Denies new or worsening sx.  Denies new sz. activity and reports compliance with meds/fim  . Seizures    HISTORY OF PRESENT ILLNESS:  Ruth Anderson is a 47 year old woman with multiple sclerosis and epilepsy.      Update 11/05/2017: She feels her MS has been stable.  She is on Tecfidera and she tolerates it well. She has not had any recent exacerbations.   MRI of the brain 11/04/2015 did not show any new lesions.    Her gait is mildly off balanced but she has no recent falls.  When tired, her left side is mildly weak  She has fatigue, mostly in the afternoon and early evenings     She usually goes to sleep around 8 am but sleeps poorly many nights and will often get out of bed for a while.   She sleeps in until 8 am.     Her mood is doing well.   She notes some cognitive issues with reduced focus and attention.  She also notes reduced memory and word finding abilities.  She denies any recent seizures. She continues on Keppra and lamotrigine.   From 05/07/2017:  MS:  She Was on Tecfidera but did not get her last order shipped to her. We discussed the importance of getting back on therapy. She does not think she had any exacerbations while she was on it and she tolerates it well.  In the past, I compare the MRI dated 11/04/2015 and to  the MRI from 11/25/2012. There was no definite change in the interim  Gait/strength/sensation:    Her gait is doing about the same. She stumbles but rarely has a fall. Generally her gait is worse when she is more tired or she is hot.  She notes left > right leg weakness  with foot drop, worse when tired. She notes mild proximal left arm weakness. She has numbness in her hands.    No dysesthetic pain.  Bladder:   Bladder function is doing well. She has mild frequency and urgency but no incontinence. She had a urinary tract infection earlier this year..   She denies any issues with bowel.      She prefers not to take any more med's.     Vision: She notes mild vision changes that have been mostly stable this year. When she looks to the right she sometimes has double vision, especially when she is tired.   Fatigue/sleep: Fatigue is worse with heat and she gets tired quickly.   She has issues with both mental and physical fatigue.   Her fatigue did better on the Tysabri than on Tecfidera. Provigil and phentermine helps the fatigue some. She feels the quality of her sleep is good most nights but she sometimes has trouble quieting her mind.    Mood/cognition:   She notes mild depression.   Her sister passed away (MI 2013-02-07) and she still sometimes feels down.  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    She notes problems with attention, memory, processing speed and word finding.   Provigil helps some.   In the past,  she was on Ritalin with benefit and better attention/focus.     Seizure:    Her last seizure was in 2014, shortly after her sister died and she needed intubation.   Her first seizure was in 1998 and they have been triggered by flashing Christmas tree lights. She has had about a dozen seizures overall with most of them occurring out of sleep but with a couple of them occurring during the daytime with severe ictal grogginess. For the most part, the seizures have been controlled on Lamictal 200 mg 3 times a day and Keppra 1500 mg twice a day.    Of note, she had breakthrough seizure on generic so we have been writing for the brand.      MS History:  She was diagnosed with MS in 1988 placed on Betaseron when it became available.  Later on  she switched to Copaxone and Avonex. When Tysabri became available in 2006, she switched and had 70 infusions over the next 6  years (monthly except for a 6 month holiday).   She tolerated Tysabri well, was stable and was JCV antibody negative. However, she did not like having to do the monthly to hour infusions. In May 2013, she started Tecfidera. She had flushing that persisted after many months but did not have significant GI issues. I last saw her on 07/29/2014 and she reported that her MS remains stable on the Tecfidera. I personally reviewed her MRI of the brain from 2015. It was compared to an MRI dated 12/12/2009. She has signal matter signal changes in the hemispheres consistent with multiple sclerosis. Many of these are radially oriented to the lateral ventricles in the corpus callosum and others are white matter. Brain stem appears normal. There were no acute findings and there was no significant changes when compared to the MRI dated 12/12/2009. Vitamin D was 32.9 in July 2015. Lymphocyte count was 0.8 on 07/29/2014.   REVIEW OF SYSTEMS: Constitutional: No fevers, chills, sweats, or change in appetite.  Notes some fatigue Eyes: as above.  No eye pain Ear, nose and throat: No hearing loss, ear pain, nasal congestion, sore throat Cardiovascular: No chest pain, palpitations Respiratory: No shortness of breath at rest or with exertion.   No wheezes GastrointestinaI: No nausea, vomiting, diarrhea, abdominal pain, fecal incontinence Genitourinary: No dysuria, urinary retention or frequency.  No nocturia. Musculoskeletal: No neck pain, back pain Integumentary: No rash, pruritus, skin lesions Neurological: as above Psychiatric: No depression at this time.  No anxiety Endocrine: No palpitations, diaphoresis, change in appetite, change in weigh or increased thirst Hematologic/Lymphatic: No anemia, purpura, petechiae. Allergic/Immunologic: No itchy/runny eyes, nasal congestion, recent allergic  reactions, rashes  ALLERGIES: No Known Allergies  HOME MEDICATIONS:  Current Outpatient Medications:  .  Dimethyl Fumarate 120 & 240 MG MISC, Take 120 mg by mouth 2 (two) times daily., Disp: 60 each, Rfl: 0 .  Dimethyl Fumarate 240 MG CPDR, Take 1 capsule (240 mg total) by mouth 2 (two) times daily., Disp: 180 capsule, Rfl: 3 .  ibuprofen (ADVIL,MOTRIN) 800 MG tablet, Take 1 tablet (800 mg total) by mouth 3 (three) times daily as needed., Disp: 270 tablet, Rfl: 1 .  LAMICTAL 200 MG tablet, Take 1 tablet (200 mg total) by mouth 3 (three) times daily. BRAND NAME LAMICTAL FOR SEIZURE DISORDER, Disp: 270 tablet, Rfl: 3 .  levETIRAcetam (KEPPRA) 750 MG tablet, Take 2 tablets (1,500 mg total) 2 (two) times daily by mouth., Disp: 360 tablet, Rfl: 3 .  methylPREDNISolone (  MEDROL DOSEPAK) 4 MG TBPK tablet, Take as directed.  6 tablets by mouth on day 1, then decrease by 1 tablet daily until gone., Disp: 21 tablet, Rfl: 0 .  modafinil (PROVIGIL) 200 MG tablet, Take 1 tablet (200 mg total) by mouth 2 (two) times daily., Disp: 180 tablet, Rfl: 1 .  phentermine 37.5 MG capsule, Take 1 capsule (37.5 mg total) by mouth every morning., Disp: 30 capsule, Rfl: 5 .  Vitamin D, Ergocalciferol, (DRISDOL) 50000 units CAPS capsule, Take 1 capsule (50,000 Units total) by mouth every 7 (seven) days., Disp: 12 capsule, Rfl: 3 No current facility-administered medications for this visit.   Facility-Administered Medications Ordered in Other Visits:  .  gadopentetate dimeglumine (MAGNEVIST) injection 17 mL, 17 mL, Intravenous, Once PRN, Guerline Happ, Pearletha Furl, MD  PAST MEDICAL HISTORY: Past Medical History:  Diagnosis Date  . Multiple sclerosis (HCC)   . Seizures (HCC)   . Vision abnormalities     PAST SURGICAL HISTORY: No past surgical history on file.  FAMILY HISTORY: Family History  Problem Relation Age of Onset  . Hypertension Mother   . Heart disease Father   . Hypertension Father   . Stroke Father   .  Diabetes type II Father     SOCIAL HISTORY:  Social History   Socioeconomic History  . Marital status: Married    Spouse name: Not on file  . Number of children: Not on file  . Years of education: Not on file  . Highest education level: Not on file  Social Needs  . Financial resource strain: Not on file  . Food insecurity - worry: Not on file  . Food insecurity - inability: Not on file  . Transportation needs - medical: Not on file  . Transportation needs - non-medical: Not on file  Occupational History  . Not on file  Tobacco Use  . Smoking status: Former Games developer  . Smokeless tobacco: Never Used  Substance and Sexual Activity  . Alcohol use: No    Alcohol/week: 0.0 oz  . Drug use: No  . Sexual activity: Not on file  Other Topics Concern  . Not on file  Social History Narrative  . Not on file     PHYSICAL EXAM  Vitals:   11/05/17 1555  BP: 124/82  Pulse: 79  Resp: 18  Weight: 167 lb (75.8 kg)  Height: 5\' 6"  (1.676 m)    Body mass index is 26.95 kg/m.   General: The patient is well-developed and well-nourished and in no acute distress  Eyes:  Funduscopic exam shows normal optic discs and retinal vessels.   Neurologic Exam  Mental status: The patient is alert and oriented x 3 at the time of the examination. The patient has apparent normal recent and remote memory, with an apparently normal attention span and concentration ability.   Speech is normal.  Cranial nerves: she has mild diplopia involving to right gaze (esp looking up and to the right). There is no nystagmus. The pupils are equal, round and reactive to light and accommodation. Color vision is symmetric. Visual acuity is symmetric. Facial strength and sensation are normal. Trapezius strength is normal..   No obvious hearing deficits are noted.  Motor:  Muscle bulk is normal.  She has mildly increased muscle tone in the legs, left greater than right.  . Strength is  5 / 5 in all 4 extremities.    Sensory:   She has intact touch and vibration sensationin all 4 extremities.  Coordination:  Cerebellar testing reveals good finger-nose-finger and heel-to-shin bilaterally.  Gait and station: Station is normal.   Gait is mildly spastic (left > right) and wide. She has a slight left foot drop. The tandem gait is wide. Romberg is negative.   Reflexes: Deep tendon reflexes are symmetric and brisk bilaterally.        DIAGNOSTIC DATA (LABS, IMAGING, TESTING) - I reviewed patient records, labs, notes, testing and imaging myself where available.     ASSESSMENT AND PLAN  Multiple sclerosis (HCC)  Epilepsy, generalized, convulsive (HCC)  Gait disturbance  Vitamin D deficiency  Disturbed cognition  Left foot drop    1.  Continue Tecfidera. We will check a CBC with differential today. 2.   Continue Lamictal and Keppra   (brand necessary as she had a severe breakthrough seizure while on generic requiring intubation).   She is advised to get 7 or more hours of sleep nightly 3.  Continue Provigil and phentermine for MS-related fatigue and sleepiness..  The combination has worked better for her.  4.   She will continue to stay active and exercises as tolerated. 5  She will return to see me in 6 months will sooner if she has new or worsening neurologic symptoms.  Kahari Critzer A. Epimenio Foot, MD, PhD 11/05/2017, 4:17 PM Certified in Neurology, Clinical Neurophysiology, Sleep Medicine, Pain Medicine and Neuroimaging  [facility] [address] [place], [place] [address] [phone]

## 2017-11-06 LAB — CBC WITH DIFFERENTIAL/PLATELET
BASOS ABS: 0.1 10*3/uL (ref 0.0–0.2)
Basos: 2 %
EOS (ABSOLUTE): 0.3 10*3/uL (ref 0.0–0.4)
Eos: 7 %
Hematocrit: 37.3 % (ref 34.0–46.6)
Hemoglobin: 12.7 g/dL (ref 11.1–15.9)
IMMATURE GRANULOCYTES: 0 %
Immature Grans (Abs): 0 10*3/uL (ref 0.0–0.1)
Lymphocytes Absolute: 1.3 10*3/uL (ref 0.7–3.1)
Lymphs: 32 %
MCH: 30.5 pg (ref 26.6–33.0)
MCHC: 34 g/dL (ref 31.5–35.7)
MCV: 89 fL (ref 79–97)
MONOS ABS: 0.5 10*3/uL (ref 0.1–0.9)
Monocytes: 13 %
NEUTROS PCT: 46 %
Neutrophils Absolute: 1.9 10*3/uL (ref 1.4–7.0)
PLATELETS: 265 10*3/uL (ref 150–379)
RBC: 4.17 x10E6/uL (ref 3.77–5.28)
RDW: 13.2 % (ref 12.3–15.4)
WBC: 4.1 10*3/uL (ref 3.4–10.8)

## 2017-11-06 LAB — VITAMIN D 25 HYDROXY (VIT D DEFICIENCY, FRACTURES): VIT D 25 HYDROXY: 35.6 ng/mL (ref 30.0–100.0)

## 2017-11-07 ENCOUNTER — Telehealth: Payer: Self-pay | Admitting: *Deleted

## 2017-11-07 NOTE — Telephone Encounter (Signed)
Spoke with Ruth Anderson and reviewed below lab results and rec. that she take otc Vit. D 5,000u daily.  She verbalized understanding of same/fim

## 2017-11-07 NOTE — Telephone Encounter (Signed)
-----   Message from Asa Lente, MD sent at 11/06/2017  1:31 PM EDT ----- Vit d is now normal    Can take 5000 Units OTC daily.

## 2018-01-24 ENCOUNTER — Other Ambulatory Visit: Payer: Self-pay | Admitting: *Deleted

## 2018-02-10 ENCOUNTER — Telehealth: Payer: Self-pay | Admitting: Neurology

## 2018-02-10 MED ORDER — IBUPROFEN 800 MG PO TABS: 800.0000 mg | ORAL_TABLET | Freq: Three times a day (TID) | ORAL | 1 refills | Status: DC | PRN

## 2018-02-10 NOTE — Telephone Encounter (Signed)
Ibuprofen escribed to [facility] as requested/fim

## 2018-02-10 NOTE — Telephone Encounter (Signed)
Pt requesting a refill for ibuprofen (ADVIL,MOTRIN) 800 MG tablet sent to [facility]

## 2018-03-06 ENCOUNTER — Other Ambulatory Visit: Payer: Self-pay | Admitting: Neurology

## 2018-03-06 NOTE — Telephone Encounter (Signed)
Rx registry checked. Last fill date is 01/27/18 for #[id number]. Last OV was 11/05/17 and next OV is 05/14/18.

## 2018-03-07 NOTE — Telephone Encounter (Signed)
Rx sent electronically.  

## 2018-03-10 ENCOUNTER — Telehealth: Payer: Self-pay | Admitting: Neurology

## 2018-03-10 NOTE — Telephone Encounter (Signed)
Pt request a call back from RN. She is having pain in the back, this is all the information she gave. Please call to advise

## 2018-03-10 NOTE — Telephone Encounter (Signed)
Spoke with Ruth Anderson.  She co new nonradiating lbp following a fall on Friday.  Appt. for eval given tomorrow 11am/fim

## 2018-03-11 ENCOUNTER — Other Ambulatory Visit: Payer: Self-pay

## 2018-03-11 ENCOUNTER — Ambulatory Visit: Payer: BLUE CROSS/BLUE SHIELD | Admitting: Neurology

## 2018-03-11 ENCOUNTER — Other Ambulatory Visit: Payer: Self-pay | Admitting: *Deleted

## 2018-03-11 ENCOUNTER — Encounter: Payer: Self-pay | Admitting: Neurology

## 2018-03-11 VITALS — BP 118/79 | HR 88 | Resp 18 | Ht 66.0 in | Wt 170.0 lb

## 2018-03-11 DIAGNOSIS — M5440 Lumbago with sciatica, unspecified side: Secondary | ICD-10-CM

## 2018-03-11 DIAGNOSIS — W19XXXA Unspecified fall, initial encounter: Secondary | ICD-10-CM

## 2018-03-11 DIAGNOSIS — G40309 Generalized idiopathic epilepsy and epileptic syndromes, not intractable, without status epilepticus: Secondary | ICD-10-CM | POA: Diagnosis not present

## 2018-03-11 DIAGNOSIS — R269 Unspecified abnormalities of gait and mobility: Secondary | ICD-10-CM | POA: Diagnosis not present

## 2018-03-11 DIAGNOSIS — M545 Low back pain, unspecified: Secondary | ICD-10-CM | POA: Insufficient documentation

## 2018-03-11 DIAGNOSIS — G35 Multiple sclerosis: Secondary | ICD-10-CM

## 2018-03-11 DIAGNOSIS — M21372 Foot drop, left foot: Secondary | ICD-10-CM | POA: Diagnosis not present

## 2018-03-11 MED ORDER — LEVETIRACETAM 750 MG PO TABS: 1500.0000 mg | ORAL_TABLET | Freq: Two times a day (BID) | ORAL | 3 refills | Status: DC

## 2018-03-11 MED ORDER — METHYLPREDNISOLONE 4 MG PO TABS: ORAL_TABLET | ORAL | 0 refills | Status: DC

## 2018-03-11 MED ORDER — LAMICTAL 200 MG PO TABS: 200.0000 mg | ORAL_TABLET | Freq: Three times a day (TID) | ORAL | 3 refills | Status: DC

## 2018-03-11 NOTE — Progress Notes (Signed)
[facility]  PATIENT: Ruth Anderson DOB: [birth year]  REFERRING DOCTOR OR PCP:  none SOURCE: patient, husband and records from [facility] neurology and images from [facility].  _________________________________   HISTORICAL  CHIEF COMPLAINT:  Chief Complaint  Patient presents with  . Multiple Sclerosis    Sts. she continues to tolerate Tecfidera well.  Here today with c/o non-radiaing lbp onset after a fall on 03/07/18.  Sts. she tripped over a call, landed on hands and knees, more to the right, on concrete. Didn't hit her head.  Sts. back pain started same day and is some better today.  Also having right shoulder pain/fim    HISTORY OF PRESENT ILLNESS:  Ruth Anderson is a 47 year old woman with multiple sclerosis and epilepsy.     Update 03/11/2018: Her MS is stable.    She tolerates Tecfidera well and has no recent exacerbation.    She is walking the same and has stumbles and rare falls.  She tripped over a cord 4 days ago and landed on the front right side, landing on hand and knee on concrete.   She has had lower back pain, midline, without radiation.   Pain is worse with standing up, bending over and with some activities and with coughing.    Pain is practically absent when sitting ir laying down.     She denies any new numbness or new weakness.    Bladder function is ok.   She has not tried any medication.      She denies any recent seizures.      Update 11/05/2017: She feels her MS has been stable.  She is on Tecfidera and she tolerates it well. She has not had any recent exacerbations.   MRI of the brain 11/04/2015 did not show any new lesions.    Her gait is mildly off balanced but she has no recent falls.  When tired, her left side is mildly weak  She has fatigue, mostly in the afternoon and early evenings     She usually goes to sleep around 8 am but sleeps poorly many nights and will often get out of bed for a while.   She sleeps in until 8 am.      Her mood is doing well.   She notes some cognitive issues with reduced focus and attention.  She also notes reduced memory and word finding abilities.  She denies any recent seizures. She continues on Keppra and lamotrigine.   From 05/07/2017:  MS:  She Was on Tecfidera but did not get her last order shipped to her. We discussed the importance of getting back on therapy. She does not think she had any exacerbations while she was on it and she tolerates it well.  In the past, I compare the MRI dated 11/04/2015 and to  the MRI from 11/25/2012. There was no definite change in the interim  Gait/strength/sensation:    Her gait is doing about the same. She stumbles but rarely has a fall. Generally her gait is worse when she is more tired or she is hot.  She notes left > right leg weakness with foot drop, worse when tired. She notes mild proximal left arm weakness. She has numbness in her hands.    No dysesthetic pain.  Bladder:   Bladder function is doing well. She has mild frequency and urgency but no incontinence. She had a urinary tract infection earlier this year..   She denies any issues with bowel.  She prefers not to take any more med's.     Vision: She notes mild vision changes that have been mostly stable this year. When she looks to the right she sometimes has double vision, especially when she is tired.   Fatigue/sleep: Fatigue is worse with heat and she gets tired quickly.   She has issues with both mental and physical fatigue.   Her fatigue did better on the Tysabri than on Tecfidera. Provigil and phentermine helps the fatigue some. She feels the quality of her sleep is good most nights but she sometimes has trouble quieting her mind.    Mood/cognition:   She notes mild depression.   Her sister passed away (MI 03/03/13) and she still sometimes feels down.  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    She notes problems with attention, memory, processing speed  and word finding.   Provigil helps some.   In the past, she was on Ritalin with benefit and better attention/focus.     Seizure:    Her last seizure was in 2014, shortly after her sister died and she needed intubation.   Her first seizure was in 1998 and they have been triggered by flashing Christmas tree lights. She has had about a dozen seizures overall with most of them occurring out of sleep but with a couple of them occurring during the daytime with severe ictal grogginess. For the most part, the seizures have been controlled on Lamictal 200 mg 3 times a day and Keppra 1500 mg twice a day.    Of note, she had breakthrough seizure on generic so we have been writing for the brand.      MS History:  She was diagnosed with MS in 1988 placed on Betaseron when it became available.  Later on she switched to Copaxone and Avonex. When Tysabri became available in 2006, she switched and had 70 infusions over the next 6  years (monthly except for a 6 month holiday).   She tolerated Tysabri well, was stable and was JCV antibody negative. However, she did not like having to do the monthly to hour infusions. In May 2013, she started Tecfidera. She had flushing that persisted after many months but did not have significant GI issues. I last saw her on 07/29/2014 and she reported that her MS remains stable on the Tecfidera. I personally reviewed her MRI of the brain from 2015. It was compared to an MRI dated 12/12/2009. She has signal matter signal changes in the hemispheres consistent with multiple sclerosis. Many of these are radially oriented to the lateral ventricles in the corpus callosum and others are white matter. Brain stem appears normal. There were no acute findings and there was no significant changes when compared to the MRI dated 12/12/2009. Vitamin D was 32.9 in July 2015. Lymphocyte count was 0.8 on 07/29/2014.   REVIEW OF SYSTEMS: Constitutional: No fevers, chills, sweats, or change in appetite.  Notes  some fatigue Eyes: as above.  No eye pain Ear, nose and throat: No hearing loss, ear pain, nasal congestion, sore throat Cardiovascular: No chest pain, palpitations Respiratory: No shortness of breath at rest or with exertion.   No wheezes GastrointestinaI: No nausea, vomiting, diarrhea, abdominal pain, fecal incontinence Genitourinary: No dysuria, urinary retention or frequency.  No nocturia. Musculoskeletal: No neck pain, back pain Integumentary: No rash, pruritus, skin lesions Neurological: as above Psychiatric: No depression at this time.  No anxiety Endocrine: No palpitations, diaphoresis,  change in appetite, change in weigh or increased thirst Hematologic/Lymphatic: No anemia, purpura, petechiae. Allergic/Immunologic: No itchy/runny eyes, nasal congestion, recent allergic reactions, rashes  ALLERGIES: No Known Allergies  HOME MEDICATIONS:  Current Outpatient Medications:  .  Dimethyl Fumarate 240 MG CPDR, Take 1 capsule (240 mg total) by mouth 2 (two) times daily., Disp: 180 capsule, Rfl: 3 .  ibuprofen (ADVIL,MOTRIN) 800 MG tablet, Take 1 tablet (800 mg total) by mouth 3 (three) times daily as needed., Disp: 270 tablet, Rfl: 1 .  LAMICTAL 200 MG tablet, Take 1 tablet (200 mg total) by mouth 3 (three) times daily. BRAND NAME LAMICTAL FOR SEIZURE DISORDER, Disp: 270 tablet, Rfl: 3 .  levETIRAcetam (KEPPRA) 750 MG tablet, Take 2 tablets (1,500 mg total) by mouth 2 (two) times daily. BRAND NAME MEDICALLY NECCESSARY, Disp: 360 tablet, Rfl: 3 .  modafinil (PROVIGIL) 200 MG tablet, TAKE 1 TABLET BY MOUTH 2 TIMES A DAY, Disp: 180 tablet, Rfl: 1 .  phentermine 37.5 MG capsule, Take 1 capsule (37.5 mg total) by mouth every morning., Disp: 30 capsule, Rfl: 5 .  Vitamin D, Ergocalciferol, (DRISDOL) 50000 units CAPS capsule, Take 1 capsule (50,000 Units total) by mouth every 7 (seven) days., Disp: 12 capsule, Rfl: 3 .  methylPREDNISolone (MEDROL) 4 MG tablet, Taper from 6 pills po for one  day to 1 pill po the last day over 6 days, Disp: 21 tablet, Rfl: 0 No current facility-administered medications for this visit.   Facility-Administered Medications Ordered in Other Visits:  .  gadopentetate dimeglumine (MAGNEVIST) injection 17 mL, 17 mL, Intravenous, Once PRN, Sater, Pearletha Furl, MD  PAST MEDICAL HISTORY: Past Medical History:  Diagnosis Date  . Multiple sclerosis (HCC)   . Seizures (HCC)   . Vision abnormalities     PAST SURGICAL HISTORY: History reviewed. No pertinent surgical history.  FAMILY HISTORY: Family History  Problem Relation Age of Onset  . Hypertension Mother   . Heart disease Father   . Hypertension Father   . Stroke Father   . Diabetes type II Father     SOCIAL HISTORY:  Social History   Socioeconomic History  . Marital status: Married    Spouse name: Not on file  . Number of children: Not on file  . Years of education: Not on file  . Highest education level: Not on file  Occupational History  . Not on file  Social Needs  . Financial resource strain: Not on file  . Food insecurity:    Worry: Not on file    Inability: Not on file  . Transportation needs:    Medical: Not on file    Non-medical: Not on file  Tobacco Use  . Smoking status: Former Games developer  . Smokeless tobacco: Never Used  Substance and Sexual Activity  . Alcohol use: No    Alcohol/week: 0.0 oz  . Drug use: No  . Sexual activity: Not on file  Lifestyle  . Physical activity:    Days per week: Not on file    Minutes per session: Not on file  . Stress: Not on file  Relationships  . Social connections:    Talks on phone: Not on file    Gets together: Not on file    Attends religious service: Not on file    Active member of club or organization: Not on file    Attends meetings of clubs or organizations: Not on file    Relationship status: Not on file  . Intimate partner violence:  Fear of current or ex partner: Not on file    Emotionally abused: Not on file     Physically abused: Not on file    Forced sexual activity: Not on file  Other Topics Concern  . Not on file  Social History Narrative  . Not on file     PHYSICAL EXAM  Vitals:   03/11/18 1023  BP: 118/79  Pulse: 88  Resp: 18  Weight: 170 lb (77.1 kg)  Height: 5\' 6"  (1.676 m)    Body mass index is 27.44 kg/m.   General: The patient is well-developed and well-nourished and in no acute distress  Musculoskeletal: She is tender over L4-L5 midline   Neurologic Exam  Mental status: The patient is alert and oriented x 3 at the time of the examination. The patient has apparent normal recent and remote memory, with an apparently normal attention span and concentration ability.   Speech is normal.  Cranial nerves: Minimal disconjugate gaze looking up to the right.  There is no nystagmus.  Visual acuity is symmetric. Facial strength and sensation are normal. Trapezius strength is normal..   No obvious hearing deficits are noted.  Motor:  Muscle bulk is normal.  She has mildly increased muscle tone in the legs, left greater than right.  . Strength is  5 / 5 in all 4 extremities.   Sensory:   Touch and vibration sensation is symmetric.  Coordination: Cerebellar testing reveals good finger-nose-finger and heel-to-shin bilaterally.  Gait and station: Station is normal.   The gait is mildly spastic on the left more than the right.  Gait is mildly wide and she has a slight left foot drop. The tandem gait is wide. Romberg is negative.   Reflexes: Deep tendon reflexes are symmetric and brisk bilaterally.        DIAGNOSTIC DATA (LABS, IMAGING, TESTING) - I reviewed patient records, labs, notes, testing and imaging myself where available.     ASSESSMENT AND PLAN  Fall, initial encounter - Plan: DG Lumbar Spine 2-3 Views  Multiple sclerosis (HCC)  Epilepsy, generalized, convulsive (HCC)  Left foot drop  Acute midline low back pain without sciatica - Plan: DG Lumbar Spine 2-3  Views    1.  Check x-ray to r/o fracture.   If pain persists, consider MRI.   Steroid pack followed by OTC NSAIDs 2.   Continue Tecfidera.   3.   Continue Lamictal and Keppra   (brand necessary as she had a severe breakthrough seizure while on generic requiring intubation).   She is advised to get 7 or more hours of sleep nightly 4.  Continue Provigil and phentermine for MS-related fatigue and sleepiness..  The combination has worked better for her.  5    She will return to see me in 6 months will sooner if she has new or worsening neurologic symptoms.  Richard A. Epimenio Foot, MD, PhD 03/11/2018, 11:30 AM Certified in Neurology, Clinical Neurophysiology, Sleep Medicine, Pain Medicine and Neuroimaging  [facility] [address] [place], [place] [address] [phone]

## 2018-03-11 NOTE — Progress Notes (Signed)
lum

## 2018-04-16 ENCOUNTER — Telehealth: Payer: Self-pay | Admitting: Neurology

## 2018-04-16 MED ORDER — LAMICTAL 200 MG PO TABS: 200.0000 mg | ORAL_TABLET | Freq: Three times a day (TID) | ORAL | 3 refills | Status: DC

## 2018-04-16 NOTE — Telephone Encounter (Signed)
Pt is asking for a call from RN to discuss her wanting the LAMICTAL 200 MG tablet to be sent to her thru  [facility] - [place], [place] - [address] [phone] (Phone) [phone] (Fax)

## 2018-04-16 NOTE — Telephone Encounter (Signed)
Spoke with Ruth Anderson.  She requested Lamictal rx. be sent to [facility]--sts. this rx. is cheaper thru mail order.  I have escribed Lamictal to [facility]/fim

## 2018-04-16 NOTE — Addendum Note (Signed)
Addended by: Candis Schatz I on: 04/16/2018 02:56 PM   Modules accepted: Orders

## 2018-04-17 NOTE — Telephone Encounter (Signed)
Pt requesting a call urgently to discuss her medication. Did not wish to discuss further with me .

## 2018-04-17 NOTE — Telephone Encounter (Signed)
Pt called stating she is needing to discuss Lamictal with RN as soon as possible

## 2018-04-17 NOTE — Telephone Encounter (Signed)
Spoke with pt.  She sts. [facility] called her and told her ins. will not cover brand name Lamictal.  I spoke with [facility] and clarified that pt. must have brand name b/c she has had breakthru sz. on generic lamictal.  I was assured that pt. is receiving brand name Lamictal, it is sched. to ship now, and the copay is $19 and change.  I passed this information along to Kaycie.  She will let me know if she needs anything else/fim

## 2018-04-22 NOTE — Telephone Encounter (Signed)
PA for brand name Lamictal 200mg  #270/90 completed via phone with [facility].  Brand name is med. nec. due to breakthru sz. on generic, the last of which resulted in intubation/ICU admission.  PA approved for dates 03/23/18 thru 04/22/19.  Case ID: [id number]/fim

## 2018-05-14 ENCOUNTER — Telehealth: Payer: Self-pay | Admitting: *Deleted

## 2018-05-14 ENCOUNTER — Ambulatory Visit: Payer: BLUE CROSS/BLUE SHIELD | Admitting: Neurology

## 2018-05-14 NOTE — Telephone Encounter (Signed)
PA for brand name Keppra 750mg  tablets #360/90 completed via [employer]. Brand name is med. nec. b/c pt. has had breakthru sz. with generic. Last sz. while on generic med. resulted in intubation/ICU admission. Has had no breakthru sz. while compliant with brand name. PA approved for dates 04/14/18 thru 05/14/19. Case ID: [id number]./fim

## 2018-06-03 ENCOUNTER — Other Ambulatory Visit: Payer: Self-pay | Admitting: Neurology

## 2018-07-21 ENCOUNTER — Telehealth: Payer: Self-pay | Admitting: Neurology

## 2018-07-21 MED ORDER — LAMICTAL 200 MG PO TABS: 200.0000 mg | ORAL_TABLET | Freq: Three times a day (TID) | ORAL | 3 refills | Status: DC

## 2018-07-21 NOTE — Telephone Encounter (Signed)
I called pt. She has been having problems getting [facility]. She would like to fill future refills at [facility], [place] on [address]. Advised we will send in new prescription for her. She verbalized understanding and appreciation.

## 2018-07-21 NOTE — Addendum Note (Signed)
Addended by: Hillis Range on: 07/21/2018 02:43 PM   Modules accepted: Orders

## 2018-07-21 NOTE — Telephone Encounter (Signed)
Patient needs her Lamictal switched today to another pharmacy.   [facility]  [place]  . Patient states she is going to run out of medication today .

## 2018-08-25 ENCOUNTER — Telehealth: Payer: Self-pay | Admitting: Neurology

## 2018-08-25 NOTE — Telephone Encounter (Signed)
Ruth Anderson- are you okay with seeing pt since there is limited availability for appt?

## 2018-08-25 NOTE — Telephone Encounter (Signed)
Pt requesting a call stating it's in regards to her MS. Did not wish to discuss with me.

## 2018-08-25 NOTE — Progress Notes (Deleted)
[facility]  PATIENT: Ruth Anderson DOB: [birth year]   REASON FOR VISIT: *** HISTORY FROM:    HISTORY OF PRESENT ILLNESS: Bannie Lobban is a 47 year old woman with multiple sclerosis and epilepsy.     Update 03/11/2018: Her MS is stable.    She tolerates Tecfidera well and has no recent exacerbation.    She is walking the same and has stumbles and rare falls.  She tripped over a cord 4 days ago and landed on the front right side, landing on hand and knee on concrete.   She has had lower back pain, midline, without radiation.   Pain is worse with standing up, bending over and with some activities and with coughing.    Pain is practically absent when sitting ir laying down.     She denies any new numbness or new weakness.    Bladder function is ok.   She has not tried any medication.      She denies any recent seizures.      Update 11/05/2017: She feels her MS has been stable.  She is on Tecfidera and she tolerates it well. She has not had any recent exacerbations.   MRI of the brain 11/04/2015 did not show any new lesions.    Her gait is mildly off balanced but she has no recent falls.  When tired, her left side is mildly weak  She has fatigue, mostly in the afternoon and early evenings     She usually goes to sleep around 8 am but sleeps poorly many nights and will often get out of bed for a while.   She sleeps in until 8 am.     Her mood is doing well.   She notes some cognitive issues with reduced focus and attention.  She also notes reduced memory and word finding abilities.  She denies any recent seizures. She continues on Keppra and lamotrigine.   From 05/07/2017:  MS:  She Was on Tecfidera but did not get her last order shipped to her. We discussed the importance of getting back on therapy. She does not think she had any exacerbations while she was on it and she tolerates it well.  In the past, I compare the MRI dated 11/04/2015 and to  the MRI from  11/25/2012. There was no definite change in the interim  Gait/strength/sensation:    Her gait is doing about the same. She stumbles but rarely has a fall. Generally her gait is worse when she is more tired or she is hot.  She notes left > right leg weakness with foot drop, worse when tired. She notes mild proximal left arm weakness. She has numbness in her hands.    No dysesthetic pain.  Bladder:   Bladder function is doing well. She has mild frequency and urgency but no incontinence. She had a urinary tract infection earlier this year..   She denies any issues with bowel.      She prefers not to take any more med's.     Vision: She notes mild vision changes that have been mostly stable this year. When she looks to the right she sometimes has double vision, especially when she is tired.   Fatigue/sleep: Fatigue is worse with heat and she gets tired quickly.   She has issues with both mental and physical fatigue.   Her fatigue did better on the Tysabri than on Tecfidera. Provigil and phentermine helps the fatigue some. She feels the quality of her sleep  is good most nights but she sometimes has trouble quieting her mind.    Mood/cognition:   She notes mild depression.   Her sister passed away (MI 03-16-13) and she still sometimes feels down.  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    She notes problems with attention, memory, processing speed and word finding.   Provigil helps some.   In the past, she was on Ritalin with benefit and better attention/focus.     Seizure:    Her last seizure was in 2014, shortly after her sister died and she needed intubation.   Her first seizure was in 1998 and they have been triggered by flashing Christmas tree lights. She has had about a dozen seizures overall with most of them occurring out of sleep but with a couple of them occurring during the daytime with severe ictal grogginess. For the most part, the seizures have been controlled  on Lamictal 200 mg 3 times a day and Keppra 1500 mg twice a day.    Of note, she had breakthrough seizure on generic so we have been writing for the brand.      MS History:  She was diagnosed with MS in 1988 placed on Betaseron when it became available.  Later on she switched to Copaxone and Avonex. When Tysabri became available in 2006, she switched and had 70 infusions over the next 6  years (monthly except for a 6 month holiday).   She tolerated Tysabri well, was stable and was JCV antibody negative. However, she did not like having to do the monthly to hour infusions. In May 2013, she started Tecfidera. She had flushing that persisted after many months but did not have significant GI issues. I last saw her on 07/29/2014 and she reported that her MS remains stable on the Tecfidera. I personally reviewed her MRI of the brain from 2015. It was compared to an MRI dated 12/12/2009. She has signal matter signal changes in the hemispheres consistent with multiple sclerosis. Many of these are radially oriented to the lateral ventricles in the corpus callosum and others are white matter. Brain stem appears normal. There were no acute findings and there was no significant changes when compared to the MRI dated 12/12/2009. Vitamin D was 32.9 in July 2015. Lymphocyte count was 0.8 on 07/29/2014.    REVIEW OF SYSTEMS: Full 14 system review of systems performed and notable only for those listed, all others are neg:  Constitutional: neg  Cardiovascular: neg Ear/Nose/Throat: neg  Skin: neg Eyes: neg Respiratory: neg Gastroitestinal: neg  Hematology/Lymphatic: neg  Endocrine: neg Musculoskeletal:neg Allergy/Immunology: neg Neurological: neg Psychiatric: neg Sleep : neg   ALLERGIES: No Known Allergies  HOME MEDICATIONS: Outpatient Medications Prior to Visit  Medication Sig Dispense Refill  . ibuprofen (ADVIL,MOTRIN) 800 MG tablet Take 1 tablet (800 mg total) by mouth 3 (three) times daily as  needed. 270 tablet 1  . LAMICTAL 200 MG tablet Take 1 tablet (200 mg total) by mouth 3 (three) times daily. BRAND NAME LAMICTAL FOR SEIZURE DISORDER 270 tablet 3  . levETIRAcetam (KEPPRA) 750 MG tablet Take 2 tablets (1,500 mg total) by mouth 2 (two) times daily. BRAND NAME MEDICALLY NECCESSARY 360 tablet 3  . methylPREDNISolone (MEDROL) 4 MG tablet Taper from 6 pills po for one day to 1 pill po the last day over 6 days 21 tablet 0  . modafinil (PROVIGIL) 200 MG tablet TAKE 1 TABLET BY MOUTH 2 TIMES A  DAY 180 tablet 1  . phentermine (ADIPEX-P) 37.5 MG tablet TAKE 1 TABLET BY MOUTH EVERY MORNING 30 tablet 5  . TECFIDERA 240 MG CPDR TAKE 1 CAPSULE BY MOUTH TWICE A DAY 180 capsule 4  . Vitamin D, Ergocalciferol, (DRISDOL) 50000 units CAPS capsule Take 1 capsule (50,000 Units total) by mouth every 7 (seven) days. 12 capsule 3   Facility-Administered Medications Prior to Visit  Medication Dose Route Frequency Provider Last Rate Last Dose  . gadopentetate dimeglumine (MAGNEVIST) injection 17 mL  17 mL Intravenous Once PRN Sater, Pearletha Furl, MD        PAST MEDICAL HISTORY: Past Medical History:  Diagnosis Date  . Multiple sclerosis (HCC)   . Seizures (HCC)   . Vision abnormalities     PAST SURGICAL HISTORY: No past surgical history on file.  FAMILY HISTORY: Family History  Problem Relation Age of Onset  . Hypertension Mother   . Heart disease Father   . Hypertension Father   . Stroke Father   . Diabetes type II Father     SOCIAL HISTORY: Social History   Socioeconomic History  . Marital status: Married    Spouse name: Not on file  . Number of children: Not on file  . Years of education: Not on file  . Highest education level: Not on file  Occupational History  . Not on file  Social Needs  . Financial resource strain: Not on file  . Food insecurity:    Worry: Not on file    Inability: Not on file  . Transportation needs:    Medical: Not on file    Non-medical: Not on file    Tobacco Use  . Smoking status: Former Games developer  . Smokeless tobacco: Never Used  Substance and Sexual Activity  . Alcohol use: No    Alcohol/week: 0.0 standard drinks  . Drug use: No  . Sexual activity: Not on file  Lifestyle  . Physical activity:    Days per week: Not on file    Minutes per session: Not on file  . Stress: Not on file  Relationships  . Social connections:    Talks on phone: Not on file    Gets together: Not on file    Attends religious service: Not on file    Active member of club or organization: Not on file    Attends meetings of clubs or organizations: Not on file    Relationship status: Not on file  . Intimate partner violence:    Fear of current or ex partner: Not on file    Emotionally abused: Not on file    Physically abused: Not on file    Forced sexual activity: Not on file  Other Topics Concern  . Not on file  Social History Narrative  . Not on file     PHYSICAL EXAM  There were no vitals filed for this visit. There is no height or weight on file to calculate BMI.  Generalized: Well developed, in no acute distress  Head: normocephalic and atraumatic,. Oropharynx benign  Neck: Supple, no carotid bruits  Cardiac: Regular rate rhythm, no murmur  Musculoskeletal: No deformity   Neurological examination   Mentation: Alert oriented to time, place, history taking. Attention span and concentration appropriate. Recent and remote memory intact.  Follows all commands speech and language fluent.   Cranial nerve II-XII: Fundoscopic exam reveals sharp disc margins.Pupils were equal round reactive to light extraocular movements were full, visual field were full on  confrontational test. Facial sensation and strength were normal. hearing was intact to finger rubbing bilaterally. Uvula tongue midline. head turning and shoulder shrug were normal and symmetric.Tongue protrusion into cheek strength was normal. Motor: normal bulk and tone, full strength in the  BUE, BLE, fine finger movements normal, no pronator drift. No focal weakness Sensory: normal and symmetric to light touch, pinprick, and  Vibration, proprioception  Coordination: finger-nose-finger, heel-to-shin bilaterally, no dysmetria Reflexes: Brachioradialis 2/2, biceps 2/2, triceps 2/2, patellar 2/2, Achilles 2/2, plantar responses were flexor bilaterally. Gait and Station: Rising up from seated position without assistance, normal stance,  moderate stride, good arm swing, smooth turning, able to perform tiptoe, and heel walking without difficulty. Tandem gait is steady  DIAGNOSTIC DATA (LABS, IMAGING, TESTING) - I reviewed patient records, labs, notes, testing and imaging myself where available.  Lab Results  Component Value Date   WBC 4.1 11/05/2017   HGB 12.7 11/05/2017   HCT 37.3 11/05/2017   MCV 89 11/05/2017   PLT 265 11/05/2017      Component Value Date/Time   NA 144 10/31/2016 1031   K 5.4 (H) 10/31/2016 1031   CL 101 10/31/2016 1031   CO2 28 10/31/2016 1031   GLUCOSE 94 10/31/2016 1031   BUN 11 10/31/2016 1031   CREATININE 0.74 10/31/2016 1031   CALCIUM 9.9 10/31/2016 1031   PROT 6.9 10/31/2016 1031   ALBUMIN 4.5 10/31/2016 1031   AST 16 10/31/2016 1031   ALT 31 10/31/2016 1031   ALKPHOS 117 10/31/2016 1031   BILITOT 0.7 10/31/2016 1031   GFRNONAA 97 10/31/2016 1031   GFRAA 112 10/31/2016 1031   No results found for: CHOL, HDL, LDLCALC, LDLDIRECT, TRIG, CHOLHDL No results found for: ZOXW9UHGBA1C No results found for: VITAMINB12 No results found for: TSH  ***  ASSESSMENT AND PLAN  47 y.o. year old female  has a past medical history of Multiple sclerosis (HCC), Seizures (HCC), and Vision abnormalities. here with ***  Fall, initial encounter - Plan: DG Lumbar Spine 2-3 Views  Multiple sclerosis (HCC)  Epilepsy, generalized, convulsive (HCC)  Left foot drop  Acute midline low back pain without sciatica - Plan: DG Lumbar Spine 2-3 Views    1.   Check x-ray to r/o fracture.   If pain persists, consider MRI.   Steroid pack followed by OTC NSAIDs 2.   Continue Tecfidera.   3.   Continue Lamictal and Keppra   (brand necessary as she had a severe breakthrough seizure while on generic requiring intubation).   She is advised to get 7 or more hours of sleep nightly 4.  Continue Provigil and phentermine for MS-related fatigue and sleepiness..  The combination has worked better for her.  5    She will return to see me in 6 months will sooner if she has new or worsening neurologic symptoms. Nilda RiggsNancy Carolyn Maclin Guerrette, James P Thompson Md PaGNP, [facility] Herm[facility] KatyBC, APRN  [facility] [address], [address] [place], [place] [id number] [phone]

## 2018-08-25 NOTE — Telephone Encounter (Addendum)
I called pt back. She states she feels her MS has gotten worse. It is harder to cross her legs, her right side is weaker. She cannot throw a Frisbee like she used to be able to. She confirmed she is taking Tecfidera 240mg  BID. Has not missed any medication. Dr. Epimenio Foot is out this week. No openings next week. MM,NP did not have any openings this week. CM,NP has opening on Thursday at 815 or 345pm. She accepted appt at 345pm.

## 2018-08-28 ENCOUNTER — Ambulatory Visit: Payer: Self-pay | Admitting: Nurse Practitioner

## 2018-09-01 NOTE — Telephone Encounter (Signed)
I called pt back. She cx appt with Carolyn,NP d/t having fever 08/28/2018. She denies having fever still. Feeling better. She would like to come in to see Dr. Epimenio Foot and discuss sx. Scheduled appt for 09/04/2018 at 9am, check in 830am. Pt verbalized understanding. Cx appt she had on 09/11/18 with Dr. Epimenio Foot since she is coming in earlier.

## 2018-09-01 NOTE — Telephone Encounter (Signed)
Pt requesting a call from RN to discuss her MS- did not wish to discuss further with me

## 2018-09-04 ENCOUNTER — Encounter: Payer: Self-pay | Admitting: Neurology

## 2018-09-04 ENCOUNTER — Telehealth: Payer: Self-pay | Admitting: *Deleted

## 2018-09-04 ENCOUNTER — Ambulatory Visit: Payer: BLUE CROSS/BLUE SHIELD | Admitting: Neurology

## 2018-09-04 VITALS — BP 105/70 | HR 82 | Wt 161.0 lb

## 2018-09-04 DIAGNOSIS — R5383 Other fatigue: Secondary | ICD-10-CM | POA: Diagnosis not present

## 2018-09-04 DIAGNOSIS — M21372 Foot drop, left foot: Secondary | ICD-10-CM

## 2018-09-04 DIAGNOSIS — R4189 Other symptoms and signs involving cognitive functions and awareness: Secondary | ICD-10-CM

## 2018-09-04 DIAGNOSIS — H532 Diplopia: Secondary | ICD-10-CM | POA: Diagnosis not present

## 2018-09-04 DIAGNOSIS — G4700 Insomnia, unspecified: Secondary | ICD-10-CM

## 2018-09-04 DIAGNOSIS — G40309 Generalized idiopathic epilepsy and epileptic syndromes, not intractable, without status epilepticus: Secondary | ICD-10-CM | POA: Diagnosis not present

## 2018-09-04 DIAGNOSIS — M5440 Lumbago with sciatica, unspecified side: Secondary | ICD-10-CM

## 2018-09-04 DIAGNOSIS — R3911 Hesitancy of micturition: Secondary | ICD-10-CM

## 2018-09-04 DIAGNOSIS — R269 Unspecified abnormalities of gait and mobility: Secondary | ICD-10-CM

## 2018-09-04 DIAGNOSIS — G35 Multiple sclerosis: Secondary | ICD-10-CM | POA: Diagnosis not present

## 2018-09-04 MED ORDER — MODAFINIL 200 MG PO TABS: 200.0000 mg | ORAL_TABLET | Freq: Two times a day (BID) | ORAL | 1 refills | Status: DC

## 2018-09-04 MED ORDER — PHENTERMINE HCL 37.5 MG PO TABS: 37.5000 mg | ORAL_TABLET | Freq: Every morning | ORAL | 5 refills | Status: DC

## 2018-09-04 NOTE — Telephone Encounter (Signed)
Placed [facility] lab in [facility] lock box for routine lab pick up.  

## 2018-09-04 NOTE — Patient Instructions (Signed)
Take melatonin 5 mg 90 minutes before bedtime

## 2018-09-04 NOTE — Progress Notes (Signed)
[facility]  PATIENT: Ruth Anderson DOB: Apr 04, 1971  REFERRING DOCTOR OR PCP:  none SOURCE: patient, husband and records from [facility] neurology and images from [facility].  _________________________________   HISTORICAL  CHIEF COMPLAINT:  Chief Complaint  Patient presents with  . Follow-up    RM 13 with husband. Last seen 03/11/2018. Having more weakness on right side. Most recent fall in December prior to Christmas. No sig. injuries. Did not hit head.   . Multiple Sclerosis    On Tecfidera  . Insomnia    Having more problems sleeping. Hard for her to go to sleep at night.     HISTORY OF PRESENT ILLNESS:  Jaelen Gellerman is a 48 year old woman with multiple sclerosis and epilepsy.     Update 09/04/2018: She is noting more issues with her MS, specifically, she feels weaker on the right.  She is on Tecfidera and has tolerated it well.  However, she feels that she did much better when she was on Tysabri than when she was on Tecfidera.  She notes some tasks are harder to do.    Her gait is ok but she tires out easily and hte right foot drags when more tired.   She stumbles and falls every month or two.  No injuries lately.    She notes blurry vision when she looks to the right.    She denies numbness or dysesthesia.    Bladder function is about the same with some hesitancy at times.  She notes double vision when she looks to the right.      Sleep is poor with trouble falling asleep and staying asleep.   She averages 5 hours/night but some nights only 4.  Occasionally, she has a night with 9-10 hours to catch up' .  She has fatigue that is better with phentermine and modafinil.   She notes some difficulty with focus and attention.     No recent seizures, last one ([facility]) was 2014.  She needs brand-name drugs due to breakthrough seizures.   Update 03/11/2018: Her MS is stable.    She tolerates Tecfidera well and has no recent exacerbation.    She is walking the  same and has stumbles and rare falls.  She tripped over a cord 4 days ago and landed on the front right side, landing on hand and knee on concrete.   She has had lower back pain, midline, without radiation.   Pain is worse with standing up, bending over and with some activities and with coughing.    Pain is practically absent when sitting ir laying down.     She denies any new numbness or new weakness.    Bladder function is ok.   She has not tried any medication.      She denies any recent seizures.      Update 11/05/2017: She feels her MS has been stable.  She is on Tecfidera and she tolerates it well. She has not had any recent exacerbations.   MRI of the brain 11/04/2015 did not show any new lesions.    Her gait is mildly off balanced but she has no recent falls.  When tired, her left side is mildly weak  She has fatigue, mostly in the afternoon and early evenings     She usually goes to sleep around 8 am but sleeps poorly many nights and will often get out of bed for a while.   She sleeps in until 8 am.  Her mood is doing well.   She notes some cognitive issues with reduced focus and attention.  She also notes reduced memory and word finding abilities.  She denies any recent seizures. She continues on Keppra and lamotrigine.   From 05/07/2017:  MS:  She Was on Tecfidera but did not get her last order shipped to her. We discussed the importance of getting back on therapy. She does not think she had any exacerbations while she was on it and she tolerates it well.  In the past, I compare the MRI dated 11/04/2015 and to  the MRI from 11/25/2012. There was no definite change in the interim  Gait/strength/sensation:    Her gait is doing about the same. She stumbles but rarely has a fall. Generally her gait is worse when she is more tired or she is hot.  She notes left > right leg weakness with foot drop, worse when tired. She notes mild proximal left arm weakness. She has numbness in her hands.     No dysesthetic pain.  Bladder:   Bladder function is doing well. She has mild frequency and urgency but no incontinence. She had a urinary tract infection earlier this year..   She denies any issues with bowel.      She prefers not to take any more med's.     Vision: She notes mild vision changes that have been mostly stable this year. When she looks to the right she sometimes has double vision, especially when she is tired.   Fatigue/sleep: Fatigue is worse with heat and she gets tired quickly.   She has issues with both mental and physical fatigue.   Her fatigue did better on the Tysabri than on Tecfidera. Provigil and phentermine helps the fatigue some. She feels the quality of her sleep is good most nights but she sometimes has trouble quieting her mind.    Mood/cognition:   She notes mild depression.   Her sister passed away (MI 2013/03/09) and she still sometimes feels down.  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    She notes problems with attention, memory, processing speed and word finding.   Provigil helps some.   In the past, she was on Ritalin with benefit and better attention/focus.     Seizure:    Her last seizure was in 2014, shortly after her sister died and she needed intubation.   Her first seizure was in 1998 and they have been triggered by flashing Christmas tree lights. She has had about a dozen seizures overall with most of them occurring out of sleep but with a couple of them occurring during the daytime with severe ictal grogginess. For the most part, the seizures have been controlled on Lamictal 200 mg 3 times a day and Keppra 1500 mg twice a day.    Of note, she had breakthrough seizure on generic so we have been writing for the brand.      MS History:  She was diagnosed with MS in 1988 placed on Betaseron when it became available.  Later on she switched to Copaxone and Avonex. When Tysabri became available in 2006, she switched and had 70 infusions  over the next 6  years (monthly except for a 6 month holiday).   She tolerated Tysabri well, was stable and was JCV antibody negative. However, she did not like having to do the monthly to hour infusions. In May 2013, she started Tecfidera. She had flushing  that persisted after many months but did not have significant GI issues. I last saw her on 07/29/2014 and she reported that her MS remains stable on the Tecfidera. I personally reviewed her MRI of the brain from 2015. It was compared to an MRI dated 12/12/2009. She has signal matter signal changes in the hemispheres consistent with multiple sclerosis. Many of these are radially oriented to the lateral ventricles in the corpus callosum and others are white matter. Brain stem appears normal. There were no acute findings and there was no significant changes when compared to the MRI dated 12/12/2009. Vitamin D was 32.9 in July 2015. Lymphocyte count was 0.8 on 07/29/2014.   REVIEW OF SYSTEMS: Constitutional: No fevers, chills, sweats, or change in appetite.  Notes some fatigue Eyes: as above.  No eye pain Ear, nose and throat: No hearing loss, ear pain, nasal congestion, sore throat Cardiovascular: No chest pain, palpitations Respiratory: No shortness of breath at rest or with exertion.   No wheezes GastrointestinaI: No nausea, vomiting, diarrhea, abdominal pain, fecal incontinence Genitourinary: No dysuria, urinary retention or frequency.  No nocturia. Musculoskeletal: No neck pain, back pain Integumentary: No rash, pruritus, skin lesions Neurological: as above Psychiatric: No depression at this time.  No anxiety Endocrine: No palpitations, diaphoresis, change in appetite, change in weigh or increased thirst Hematologic/Lymphatic: No anemia, purpura, petechiae. Allergic/Immunologic: No itchy/runny eyes, nasal congestion, recent allergic reactions, rashes  ALLERGIES: No Known Allergies  HOME MEDICATIONS:  Current Outpatient Medications:    .  ibuprofen (ADVIL,MOTRIN) 800 MG tablet, Take 1 tablet (800 mg total) by mouth 3 (three) times daily as needed., Disp: 270 tablet, Rfl: 1 .  LAMICTAL 200 MG tablet, Take 1 tablet (200 mg total) by mouth 3 (three) times daily. BRAND NAME LAMICTAL FOR SEIZURE DISORDER, Disp: 270 tablet, Rfl: 3 .  levETIRAcetam (KEPPRA) 750 MG tablet, Take 2 tablets (1,500 mg total) by mouth 2 (two) times daily. BRAND NAME MEDICALLY NECCESSARY, Disp: 360 tablet, Rfl: 3 .  methylPREDNISolone (MEDROL) 4 MG tablet, Taper from 6 pills po for one day to 1 pill po the last day over 6 days, Disp: 21 tablet, Rfl: 0 .  modafinil (PROVIGIL) 200 MG tablet, Take 1 tablet (200 mg total) by mouth 2 (two) times daily., Disp: 180 tablet, Rfl: 1 .  phentermine (ADIPEX-P) 37.5 MG tablet, Take 1 tablet (37.5 mg total) by mouth every morning., Disp: 30 tablet, Rfl: 5 .  TECFIDERA 240 MG CPDR, TAKE 1 CAPSULE BY MOUTH TWICE A DAY, Disp: 180 capsule, Rfl: 4 .  VITAMIN D PO, Take 5,000 Units by mouth daily., Disp: , Rfl:  No current facility-administered medications for this visit.   Facility-Administered Medications Ordered in Other Visits:  .  gadopentetate dimeglumine (MAGNEVIST) injection 17 mL, 17 mL, Intravenous, Once PRN, Sater, Pearletha Furl, MD  PAST MEDICAL HISTORY: Past Medical History:  Diagnosis Date  . Multiple sclerosis (HCC)   . Seizures (HCC)   . Vision abnormalities     PAST SURGICAL HISTORY: History reviewed. No pertinent surgical history.  FAMILY HISTORY: Family History  Problem Relation Age of Onset  . Hypertension Mother   . Heart disease Father   . Hypertension Father   . Stroke Father   . Diabetes type II Father     SOCIAL HISTORY:  Social History   Socioeconomic History  . Marital status: Married    Spouse name: Not on file  . Number of children: Not on file  . Years of education: Not on  file  . Highest education level: Not on file  Occupational History  . Not on file  Social Needs  .  Financial resource strain: Not on file  . Food insecurity:    Worry: Not on file    Inability: Not on file  . Transportation needs:    Medical: Not on file    Non-medical: Not on file  Tobacco Use  . Smoking status: Former Games developer  . Smokeless tobacco: Never Used  Substance and Sexual Activity  . Alcohol use: No    Alcohol/week: 0.0 standard drinks  . Drug use: No  . Sexual activity: Not on file  Lifestyle  . Physical activity:    Days per week: Not on file    Minutes per session: Not on file  . Stress: Not on file  Relationships  . Social connections:    Talks on phone: Not on file    Gets together: Not on file    Attends religious service: Not on file    Active member of club or organization: Not on file    Attends meetings of clubs or organizations: Not on file    Relationship status: Not on file  . Intimate partner violence:    Fear of current or ex partner: Not on file    Emotionally abused: Not on file    Physically abused: Not on file    Forced sexual activity: Not on file  Other Topics Concern  . Not on file  Social History Narrative  . Not on file     PHYSICAL EXAM  Vitals:   09/04/18 0859  BP: 105/70  Pulse: 82  SpO2: 97%  Weight: 161 lb (73 kg)    Body mass index is 25.99 kg/m.   General: The patient is well-developed and well-nourished and in no acute distress  Musculoskeletal: She is tender over L4-L5 midline   Neurologic Exam  Mental status: The patient is alert and oriented x 3 at the time of the examination. The patient has apparent normal recent and remote memory, with an apparently normal attention span and concentration ability.   Speech is normal.  Cranial nerves: Minimal disconjugate gaze looking up to the right.  There is no nystagmus.  Visual acuity is symmetric. Facial strength and sensation are normal. Trapezius strength is normal..   No obvious hearing deficits are noted.  Motor:  Muscle bulk is normal.  She has mildly increased  muscle tone in the legs, left greater than right.  . Strength is  5 / 5 in the arms but has slight left foot weakness.  Sensory:   She has normal sensation to touch and vibration.  Coordination: Cerebellar testing reveals good finger-nose-finger and heel-to-shin bilaterally.  Gait and station: Station is normal.   The gait is mildly spastic on the left more than the right.  The gait is mildly wide.  She has a mild left foot drop.. The tandem gait is wide. Romberg is negative.   Reflexes: Deep tendon reflexes are symmetric and brisk bilaterally.        DIAGNOSTIC DATA (LABS, IMAGING, TESTING) - I reviewed patient records, labs, notes, testing and imaging myself where available.     ASSESSMENT AND PLAN  Multiple sclerosis (HCC) - Plan: Stratify JCV Antibody Test ([facility]), CBC with Differential/Platelet, MR BRAIN W WO CONTRAST  Epilepsy, generalized, convulsive (HCC)  Other fatigue  Diplopia  Gait disturbance  Urinary hesitancy  Disturbed cognition  Left foot drop  Acute right-sided low back pain with  sciatica, sciatica laterality unspecified  Insomnia, unspecified type    1.  She would like to go back on Tysabri as her MS was better controlled than on Tecfidera.  She signed a service request form.  We will check a JCV antibody status and send it and if she is still JCV ab negative. 2.  Continue Lamictal and Keppra   (brand necessary as she had a severe breakthrough seizure while on generic requiring intubation).    3.  Melatonin for sleep.  Removed the modafinil dose earlier in the day so that there is less effect on sleep.  She is advised to get 7 or more hours of sleep nightly 4.  Continue  phentermine for MS-related fatigue and sleepiness..  The combination with Provigil has worked better for her.  5    She will return to see me in 6 months will sooner if she has new or worsening neurologic symptoms.  40-minute face-to-face evaluation with greater than one half the  time counseling or coordinating care about her MS and multiple related symptoms and also going over her disease modifying therapy options  Richard A. Epimenio FootSater, MD, PhD 09/04/2018, 5:11 PM Certified in Neurology, Clinical Neurophysiology, Sleep Medicine, Pain Medicine and Neuroimaging  [facility] [address], [address] [place], [place] [address] [phone]

## 2018-09-05 LAB — CBC WITH DIFFERENTIAL/PLATELET
BASOS: 2 %
Basophils Absolute: 0.1 10*3/uL (ref 0.0–0.2)
EOS (ABSOLUTE): 0.1 10*3/uL (ref 0.0–0.4)
Eos: 2 %
Hematocrit: 37.4 % (ref 34.0–46.6)
Hemoglobin: 13 g/dL (ref 11.1–15.9)
IMMATURE GRANULOCYTES: 0 %
Immature Grans (Abs): 0 10*3/uL (ref 0.0–0.1)
Lymphocytes Absolute: 1 10*3/uL (ref 0.7–3.1)
Lymphs: 32 %
MCH: 31.9 pg (ref 26.6–33.0)
MCHC: 34.8 g/dL (ref 31.5–35.7)
MCV: 92 fL (ref 79–97)
MONOS ABS: 0.3 10*3/uL (ref 0.1–0.9)
Monocytes: 10 %
NEUTROS PCT: 54 %
Neutrophils Absolute: 1.7 10*3/uL (ref 1.4–7.0)
PLATELETS: 244 10*3/uL (ref 150–450)
RBC: 4.07 x10E6/uL (ref 3.77–5.28)
RDW: 11.5 % — AB (ref 11.7–15.4)
WBC: 3.2 10*3/uL — AB (ref 3.4–10.8)

## 2018-09-08 ENCOUNTER — Telehealth: Payer: Self-pay | Admitting: Neurology

## 2018-09-08 NOTE — Telephone Encounter (Signed)
lvm for pt to call back about scheduling mri  [employer] Auth: NPR Ref # [id number]

## 2018-09-09 NOTE — Telephone Encounter (Signed)
I spoke to the patient she would like to have the MRI at [facility] I informed her once the order is signed by Dr. Epimenio Foot I will faxed it and they will reach out to her to schedule.

## 2018-09-09 NOTE — Telephone Encounter (Signed)
Pt has returned call to Emily, she is asking for a call back °

## 2018-09-11 ENCOUNTER — Ambulatory Visit: Payer: BLUE CROSS/BLUE SHIELD | Admitting: Neurology

## 2018-09-12 ENCOUNTER — Telehealth: Payer: Self-pay | Admitting: *Deleted

## 2018-09-12 NOTE — Telephone Encounter (Signed)
Submitted PA modafinil on covermymeds. Key: [id number]. Submitted for #180, 90 days.   PA approved 08/13/2018-09/12/2019. [id number].

## 2018-09-15 ENCOUNTER — Telehealth: Payer: Self-pay | Admitting: *Deleted

## 2018-09-15 NOTE — Telephone Encounter (Signed)
Gave completed/signed Tysabri start form to [facility] to start processing for patient.

## 2018-09-15 NOTE — Telephone Encounter (Signed)
Received fax that JCV ab drawn on 09/04/2018 indeterminate, titer: 0.23. Inhibition assay: negative.  Gave to Dr. Epimenio Foot for review.

## 2018-09-29 ENCOUNTER — Telehealth: Payer: Self-pay | Admitting: Neurology

## 2018-09-29 MED ORDER — METHYLPREDNISOLONE 4 MG PO TABS: ORAL_TABLET | ORAL | 0 refills | Status: DC

## 2018-09-29 NOTE — Telephone Encounter (Signed)
Spoke with Dr. Epimenio Foot- labs were fine. JCV negative. I will call her to get more info about back pain

## 2018-09-29 NOTE — Telephone Encounter (Signed)
Spoke with Dr. Epimenio Foot- okay to call in medrol dose pak for back pain

## 2018-09-29 NOTE — Telephone Encounter (Signed)
I called pt back. She would like rx to go to [facility] on file. I e-scribed rx.

## 2018-09-29 NOTE — Telephone Encounter (Addendum)
I called pt back. Relayed lab results. She states she was doing house work recently and she is now having increased back pain. Pain located middle of back. This is the same pain she has had in the past. She has tried taking ibuprofen 800mg  and it helps but she does not like to take this all the time. She is requesting medrol dose pak be called in for pain. Advised I will discuss with Dr. Epimenio Foot and call back.   She also had not heard about scheduling Tysabri infusion. I reviewed her chart and start form was given to [facility] on 09/15/2018. Advised I will let [facility] know to call her with update. She verbalized understanding.I printed message and gave to [facility].

## 2018-09-29 NOTE — Telephone Encounter (Signed)
Pt would like to receive her Lab results she also states she is experiencing back pain and would like to know if she could get some medication prescribed to her. Please advise.

## 2018-10-07 ENCOUNTER — Telehealth: Payer: Self-pay | Admitting: Neurology

## 2018-10-07 NOTE — Telephone Encounter (Addendum)
I called pt back. She finished taking medrol dos pak that Dr. Epimenio Foot provided last week on 10/03/18. Feels it has helped her vision some and she is not having blurry vision right now. Prior to steroids, she would look to the right and have blurry vision. She is requesting more steroids. Advised I would have to speak w/ Dr. Epimenio Foot and call back. She verbalized understanding.  She also had MRI brain that was ordered by Dr. Epimenio Foot but has not heard back about results yet. She had it completed in [place], [place] at [facility]. Advised we will try and get these results and will let her know.

## 2018-10-07 NOTE — Telephone Encounter (Signed)
I called pt back. Relayed below information. She verbalized understanding. She is wanting update about getting scheduled for infusion. Advised I will ask [facility] to check on this and call to give her an update.   I printed message and gave to Gina([facility]).

## 2018-10-07 NOTE — Telephone Encounter (Addendum)
Spoke w/ Dr. Epimenio Foot- he does not want to prescribe further steroids at this time. Long term steroid use can cause unwanted SE.   He pulled up MRI results in canopy. MRI was compared from her 2017 MRI and it was unchanged. No new lesions.

## 2018-10-07 NOTE — Telephone Encounter (Signed)
Pt is asking for a call from RN to discuss how prednisone has had an impact on her vision.

## 2018-10-21 ENCOUNTER — Telehealth: Payer: Self-pay | Admitting: Neurology

## 2018-10-21 NOTE — Telephone Encounter (Signed)
pt husband has called for the [facility] suite, call transferred

## 2018-11-17 ENCOUNTER — Other Ambulatory Visit: Payer: Self-pay | Admitting: Neurology

## 2018-11-19 ENCOUNTER — Telehealth: Payer: Self-pay | Admitting: *Deleted

## 2018-11-19 NOTE — Telephone Encounter (Signed)
I called pt. I received email from Londra([facility]):  "This patient will have a 20% OOP.  Despite many calls from [place] and [employer], pt refuses financial assistance. Please reach out and let pt know this.  She will owe $1770 OOP and will need to make a payment plan with [place] in order to move forward.She can call [phone], option 2."  I relayed above info. Pt claims she does not need financial assistance that her copay should be 30 dollars for infusion. She will call number above and see if she can get this resolved. She will call back if any further questions/concerns. I emailed Londra back to let her know I spoke with pt.

## 2018-12-02 ENCOUNTER — Telehealth: Payer: Self-pay | Admitting: *Deleted

## 2018-12-02 NOTE — Telephone Encounter (Signed)
Received fax notification from touch prescribing program that pt re-authorized for Tysabri from 12/02/18-06/02/19. Enrollment number: [id number]. Account: [employer]. Site auth number: [id number].

## 2018-12-03 ENCOUNTER — Telehealth: Payer: Self-pay | Admitting: Neurology

## 2018-12-03 NOTE — Telephone Encounter (Signed)
I called pt back. She has had some vision changes after her infusion yesterday. She was having some blurry vision this morning but it has resolved. No further issues. Advised her to continue to monitor and call back if sx return. She verbalized understanding.

## 2018-12-03 NOTE — Telephone Encounter (Signed)
Called, LVM returning pt call 

## 2018-12-03 NOTE — Telephone Encounter (Signed)
Pt called in and stated she is having blurred vision that started today, she states she took tysarbi on yesterday and she really needs to speak with someone about it

## 2019-03-02 ENCOUNTER — Other Ambulatory Visit: Payer: Self-pay | Admitting: Neurology

## 2019-03-11 DIAGNOSIS — Z0289 Encounter for other administrative examinations: Secondary | ICD-10-CM

## 2019-03-16 ENCOUNTER — Other Ambulatory Visit: Payer: Self-pay | Admitting: Neurology

## 2019-03-16 NOTE — Telephone Encounter (Signed)
 Database Verified LR: 02-10-2019 Qty: 60 Pending appointment: 06-03-2019

## 2019-03-17 ENCOUNTER — Telehealth: Payer: Self-pay | Admitting: *Deleted

## 2019-03-17 NOTE — Telephone Encounter (Signed)
Gave completed/signed DMV form back to medical records to process for pt. 

## 2019-03-23 ENCOUNTER — Other Ambulatory Visit: Payer: Self-pay | Admitting: Neurology

## 2019-04-13 ENCOUNTER — Other Ambulatory Visit: Payer: Self-pay | Admitting: Neurology

## 2019-04-21 ENCOUNTER — Telehealth: Payer: Self-pay | Admitting: *Deleted

## 2019-04-21 ENCOUNTER — Other Ambulatory Visit: Payer: Self-pay | Admitting: *Deleted

## 2019-04-21 DIAGNOSIS — G35 Multiple sclerosis: Secondary | ICD-10-CM

## 2019-04-21 DIAGNOSIS — Z79899 Other long term (current) drug therapy: Secondary | ICD-10-CM

## 2019-04-21 NOTE — Telephone Encounter (Signed)
Placed JCV lab in [facility] lock box for routine lab pick up. Results pending. 

## 2019-04-21 NOTE — Progress Notes (Signed)
Pt here for Tysabri infusion today and due for JCV lab/CBC. Placed lab order. Liane aware and pt will complete while here.

## 2019-04-28 NOTE — Telephone Encounter (Signed)
JCV ab drawn on 04/21/19 indeterminate, index: 0.20. Inhibition assay: negative.

## 2019-05-06 ENCOUNTER — Telehealth: Payer: Self-pay | Admitting: *Deleted

## 2019-05-06 NOTE — Telephone Encounter (Signed)
Received fax from touch prescribing program that pt re-authorized for Tysabri 05/06/19-12/02/19. Patient enrollment number: [id number]. Account: [employer]. Site auth number: [id number].

## 2019-05-06 NOTE — Telephone Encounter (Signed)
Faxed completed/signed Tysabri pt status report and reauth questionnaire to [facility] at [phone]. Received confirmation.  

## 2019-05-21 ENCOUNTER — Other Ambulatory Visit: Payer: Self-pay | Admitting: Neurology

## 2019-06-03 ENCOUNTER — Encounter: Payer: Self-pay | Admitting: Neurology

## 2019-06-03 ENCOUNTER — Other Ambulatory Visit: Payer: Self-pay

## 2019-06-03 ENCOUNTER — Ambulatory Visit: Payer: BLUE CROSS/BLUE SHIELD | Admitting: Neurology

## 2019-06-03 VITALS — BP 122/80 | HR 81 | Temp 98.4°F | Ht 67.0 in | Wt 171.0 lb

## 2019-06-03 DIAGNOSIS — G35 Multiple sclerosis: Secondary | ICD-10-CM

## 2019-06-03 DIAGNOSIS — H532 Diplopia: Secondary | ICD-10-CM

## 2019-06-03 DIAGNOSIS — R269 Unspecified abnormalities of gait and mobility: Secondary | ICD-10-CM | POA: Diagnosis not present

## 2019-06-03 DIAGNOSIS — G40309 Generalized idiopathic epilepsy and epileptic syndromes, not intractable, without status epilepticus: Secondary | ICD-10-CM | POA: Diagnosis not present

## 2019-06-03 DIAGNOSIS — R5383 Other fatigue: Secondary | ICD-10-CM

## 2019-06-03 DIAGNOSIS — N3000 Acute cystitis without hematuria: Secondary | ICD-10-CM

## 2019-06-03 NOTE — Progress Notes (Signed)
[facility]  PATIENT: Ruth Anderson DOB: Oct 03, 1970  REFERRING DOCTOR OR PCP:  none SOURCE: [facility], [facility] and [facility] and images from [facility].  _________________________________   HISTORICAL  CHIEF COMPLAINT:  Chief Complaint  Patient presents with   Follow-up    Rm 12 here for 6 month f/u on MS    Multiple Sclerosis    On Mathis Dadysbri reports she doing well. Last in fusion 1-2 weeks ago    HISTORY OF PRESENT ILLNESS:  Ruth NumbersLeigh Anderson is a 48 year old woman with multiple sclerosis and epilepsy.     Update 06/03/2019:  She feels stable in general with no exacerbations.   She is on Tysabri and feels better than she did when she was on Tecfidera,   She tolerates it well.   She notes more urinary urgency.   She has mild diplopia at times when she looks to the right but not left.   She notes it most when she drives.  She is walking the same---mildly off balanced but no falls.   She has had a few falls.    She has not had any seizures since 2014.    She is on Keppra and Lamictal and tolerates them well.   She has a lot of twitching (mostly with her neck/head that bothers her but no change in level of consciousness)  This is single jerk at a time.      She notes fatigue in the afternoons.  Phentermine is helping some.     She is noting some UTI's  Update 09/04/2018: She is noting more issues with her MS, specifically, she feels weaker on the right.  She is on Tecfidera and has tolerated it well.  However, she feels that she did much better when she was on Tysabri than when she was on Tecfidera.  She notes some tasks are harder to do.    Her gait is ok but she tires out easily and hte right foot drags when more tired.   She stumbles and falls every month or two.  No injuries lately.    She notes blurry vision when she looks to the right.    She denies numbness or dysesthesia.    Bladder function is about the same with some hesitancy at  times.  She notes double vision when she looks to the right.      Sleep is poor with trouble falling asleep and staying asleep.   She averages 5 hours/night but some nights only 4.  Occasionally, she has a night with 9-10 hours to catch up' .  She has fatigue that is better with phentermine and modafinil.   She notes some difficulty with focus and attention.     No recent seizures, last one ([facility]) was 2014.  She needs brand-name drugs due to breakthrough seizures.   Update 03/11/2018: Her MS is stable.    She tolerates Tecfidera well and has no recent exacerbation.    She is walking the same and has stumbles and rare falls.  She tripped over a cord 4 days ago and landed on the front right side, landing on hand and knee on concrete.   She has had lower back pain, midline, without radiation.   Pain is worse with standing up, bending over and with some activities and with coughing.    Pain is practically absent when sitting ir laying down.     She denies any new numbness or new weakness.    Bladder  function is ok.   She has not tried any medication.      She denies any recent seizures.      Update 11/05/2017: She feels her MS has been stable.  She is on Tecfidera and she tolerates it well. She has not had any recent exacerbations.   MRI of the brain 11/04/2015 did not show any new lesions.    Her gait is mildly off balanced but she has no recent falls.  When tired, her left side is mildly weak  She has fatigue, mostly in the afternoon and early evenings     She usually goes to sleep around 8 am but sleeps poorly many nights and will often get out of bed for a while.   She sleeps in until 8 am.     Her mood is doing well.   She notes some cognitive issues with reduced focus and attention.  She also notes reduced memory and word finding abilities.  She denies any recent seizures. She continues on Keppra and lamotrigine.   From 05/07/2017:  MS:  She Was on Tecfidera but did not get her last order  shipped to her. We discussed the importance of getting back on therapy. She does not think she had any exacerbations while she was on it and she tolerates it well.  In the past, I compare the MRI dated 11/04/2015 and to  the MRI from 11/25/2012. There was no definite change in the interim  Gait/strength/sensation:    Her gait is doing about the same. She stumbles but rarely has a fall. Generally her gait is worse when she is more tired or she is hot.  She notes left > right leg weakness with foot drop, worse when tired. She notes mild proximal left arm weakness. She has numbness in her hands.    No dysesthetic pain.  Bladder:   Bladder function is doing well. She has mild frequency and urgency but no incontinence. She had a urinary tract infection earlier this year..   She denies any issues with bowel.      She prefers not to take any more med's.     Vision: She notes mild vision changes that have been mostly stable this year. When she looks to the right she sometimes has double vision, especially when she is tired.   Fatigue/sleep: Fatigue is worse with heat and she gets tired quickly.   She has issues with both mental and physical fatigue.   Her fatigue did better on the Tysabri than on Tecfidera. Provigil and phentermine helps the fatigue some. She feels the quality of her sleep is good most nights but she sometimes has trouble quieting her mind.    Mood/cognition:   She notes mild depression.   Her sister passed away (MI 02-25-13) and she still sometimes feels down.  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    She notes problems with attention, memory, processing speed and word finding.   Provigil helps some.   In the past, she was on Ritalin with benefit and better attention/focus.     Seizure:    Her last seizure was in 2014, shortly after her sister died and she needed intubation.   Her first seizure was in 1998 and they have been triggered by flashing Christmas tree  lights. She has had about a dozen seizures overall with most of them occurring out of sleep but with a couple of them occurring during the  daytime with severe ictal grogginess. For the most part, the seizures have been controlled on Lamictal 200 mg 3 times a day and Keppra 1500 mg twice a day.    Of note, she had breakthrough seizure on generic so we have been writing for the brand.      MS History:  She was diagnosed with MS in 1988 placed on Betaseron when it became available.  Later on she switched to Copaxone and Avonex. When Tysabri became available in 2006, she switched and had 70 infusions over the next 6  years (monthly except for a 6 month holiday).   She tolerated Tysabri well, was stable and was JCV antibody negative. However, she did not like having to do the monthly to hour infusions. In May 2013, she started Tecfidera. She had flushing that persisted after many months but did not have significant GI issues. I last saw her on 07/29/2014 and she reported that her MS remains stable on the Tecfidera. I personally reviewed her MRI of the brain from 2015. It was compared to an MRI dated 12/12/2009. She has signal matter signal changes in the hemispheres consistent with multiple sclerosis. Many of these are radially oriented to the lateral ventricles in the corpus callosum and others are white matter. Brain stem appears normal. There were no acute findings and there was no significant changes when compared to the MRI dated 12/12/2009. Vitamin D was 32.9 in July 2015. Lymphocyte count was 0.8 on 07/29/2014.   REVIEW OF SYSTEMS: Constitutional: No fevers, chills, sweats, or change in appetite.  Notes some fatigue Eyes: as above.  No eye pain Ear, nose and throat: No hearing loss, ear pain, nasal congestion, sore throat Cardiovascular: No chest pain, palpitations Respiratory: No shortness of breath at rest or with exertion.   No wheezes GastrointestinaI: No nausea, vomiting, diarrhea, abdominal  pain, fecal incontinence Genitourinary: No dysuria, urinary retention or frequency.  No nocturia. Musculoskeletal: No neck pain, back pain Integumentary: No rash, pruritus, skin lesions Neurological: as above Psychiatric: No depression at this time.  No anxiety Endocrine: No palpitations, diaphoresis, change in appetite, change in weigh or increased thirst Hematologic/Lymphatic: No anemia, purpura, petechiae. Allergic/Immunologic: No itchy/runny eyes, nasal congestion, recent allergic reactions, rashes  ALLERGIES: No Known Allergies  HOME MEDICATIONS:  Current Outpatient Medications:    ibuprofen (ADVIL) 800 MG tablet, TAKE 1 TABLET BY MOUTH 3 TIMES DAILY WITH FOOD AS NEEDED FOR PAIN & INFLAMMATION, Disp: 270 tablet, Rfl: 0   LAMICTAL 200 MG tablet, TAKE 1 TABLET THREE TIMES A DAY FOR SEIZURE DISORDER, Disp: 270 tablet, Rfl: 3   levETIRAcetam (KEPPRA) 750 MG tablet, Take 2 tablets (1,500 mg total) by mouth 2 (two) times daily. BRAND NAME MEDICALLY NECCESSARY, Disp: 360 tablet, Rfl: 3   methylPREDNISolone (MEDROL) 4 MG tablet, Take 6 pills by mouth on day one and taper down by one pill each day until finished, Disp: 21 tablet, Rfl: 0   modafinil (PROVIGIL) 200 MG tablet, TAKE 1 TABLET BY MOUTH 2 TIMES A DAY, Disp: 180 tablet, Rfl: 1   natalizumab (TYSABRI) 300 MG/15ML injection, Inject into the vein., Disp: , Rfl:    phentermine (ADIPEX-P) 37.5 MG tablet, TAKE 1 TABLET BY MOUTH EVERY MORNING, Disp: 30 tablet, Rfl: 5   VITAMIN D PO, Take 5,000 Units by mouth daily., Disp: , Rfl:  No current facility-administered medications for this visit.   Facility-Administered Medications Ordered in Other Visits:    gadopentetate dimeglumine (MAGNEVIST) injection 17 mL, 17 mL, Intravenous, Once  PRN, Whitlee Sluder, Pearletha Furlichard A, MD  PAST MEDICAL HISTORY: Past Medical History:  Diagnosis Date   Multiple sclerosis (HCC)    Seizures (HCC)    Vision abnormalities     PAST SURGICAL  HISTORY: History reviewed. No pertinent surgical history.  FAMILY HISTORY: Family History  Problem Relation Age of Onset   Hypertension Mother    Heart disease Father    Hypertension Father    Stroke Father    Diabetes type II Father     SOCIAL HISTORY:  Social History   Socioeconomic History   Marital status: Married    Spouse name: Not on file   Number of children: Not on file   Years of education: Not on file   Highest education level: Not on file  Occupational History   Not on file  Social Needs   Financial resource strain: Not on file   Food insecurity    Worry: Not on file    Inability: Not on file   Transportation needs    Medical: Not on file    Non-medical: Not on file  Tobacco Use   Smoking status: Former Smoker   Smokeless tobacco: Never Used  Substance and Sexual Activity   Alcohol use: No    Alcohol/week: 0.0 standard drinks   Drug use: No   Sexual activity: Not on file  Lifestyle   Physical activity    Days per week: Not on file    Minutes per session: Not on file   Stress: Not on file  Relationships   Social connections    Talks on phone: Not on file    Gets together: Not on file    Attends religious service: Not on file    Active member of club or organization: Not on file    Attends meetings of clubs or organizations: Not on file    Relationship status: Not on file   Intimate partner violence    Fear of current or ex partner: Not on file    Emotionally abused: Not on file    Physically abused: Not on file    Forced sexual activity: Not on file  Other Topics Concern   Not on file  Social History Narrative   Not on file     PHYSICAL EXAM  Vitals:   06/03/19 1258  BP: 122/80  Pulse: 81  Temp: 98.4 F (36.9 C)  TempSrc: Temporal  SpO2: 98%  Weight: 171 lb (77.6 kg)  Height: 5\' 7"  (1.702 m)    Body mass index is 26.78 kg/m.   General: The patient is well-developed and well-nourished and in no acute  distress  Musculoskeletal: She is tender over L4-L5 midline   Neurologic Exam  Mental status: The patient is alert and oriented x 3 at the time of the examination. The patient has apparent normal recent and remote memory, with an apparently normal attention span and concentration ability.   Speech is normal.  Cranial nerves: She noted mild diplopia but I could not see a dysconjugate gaze looking up to the right.  There is no nystagmus.  Visual acuity is symmetric. Facial strength and sensation are normal. Trapezius strength is normal..   No obvious hearing deficits are noted.  Motor:  Muscle bulk is normal.  She has mildly increased muscle tone in the legs, left greater than right.  . Strength is  5 / 5 in the arms but has slight left foot weakness.  Sensory:   She has normal sensation to touch and  vibration.  Coordination: Cerebellar testing reveals good finger-nose-finger and heel-to-shin bilaterally.  Gait and station: Station is normal.   The gait is mildly spastic on the left more than the right.  The gait is mildly wide.  Mild left foot drop and tandem gait is poor. Romberg is negative.   Reflexes: Deep tendon reflexes are symmetric and brisk bilaterally.        DIAGNOSTIC DATA (LABS, IMAGING, TESTING) - I reviewed patient records, labs, notes, testing and imaging myself where available.     ASSESSMENT AND PLAN  Multiple sclerosis (HCC) - Plan: CBC with Differential/Platelet, Stratify JCV Antibody Test ([facility])  Acute cystitis without hematuria - Plan: CBC with Differential/Platelet, Urinalysis, Routine w reflex microscopic, Culture, Urine  Epilepsy, generalized, convulsive (HCC) - Plan: CBC with Differential/Platelet    1. Continue Tysabri.    Check JCV and CBC. 2.  Continue Lamictal and Keppra   (brand necessary as she had a severe breakthrough seizure while on generic requiring intubation).    3.  Try off of phentermine x several days to see if twitching improves, if  not can go back on.   Continue modafinil. 4.   Check UA and Cx for urinary frequency  5    She will return to see me in 6 months will sooner if she has new or worsening neurologic symptoms.   Emily Forse A. Epimenio Foot, MD, PhD 06/03/2019, 1:43 PM Certified in Neurology, Clinical Neurophysiology, Sleep Medicine, Pain Medicine and Neuroimaging  [facility] [address] [place], [place] [address] [phone]

## 2019-06-03 NOTE — Progress Notes (Signed)
JCV placed in [facility] pick up at [facility].

## 2019-06-04 ENCOUNTER — Telehealth: Payer: Self-pay | Admitting: *Deleted

## 2019-06-04 LAB — CBC WITH DIFFERENTIAL/PLATELET
Basophils Absolute: 0.1 10*3/uL (ref 0.0–0.2)
Basos: 2 %
EOS (ABSOLUTE): 0.4 10*3/uL (ref 0.0–0.4)
Eos: 7 %
Hematocrit: 36.7 % (ref 34.0–46.6)
Hemoglobin: 12.3 g/dL (ref 11.1–15.9)
Immature Grans (Abs): 0 10*3/uL (ref 0.0–0.1)
Immature Granulocytes: 0 %
Lymphocytes Absolute: 2.4 10*3/uL (ref 0.7–3.1)
Lymphs: 46 %
MCH: 30.2 pg (ref 26.6–33.0)
MCHC: 33.5 g/dL (ref 31.5–35.7)
MCV: 90 fL (ref 79–97)
Monocytes Absolute: 0.5 10*3/uL (ref 0.1–0.9)
Monocytes: 10 %
Neutrophils Absolute: 1.8 10*3/uL (ref 1.4–7.0)
Neutrophils: 35 %
Platelets: 257 10*3/uL (ref 150–450)
RBC: 4.07 x10E6/uL (ref 3.77–5.28)
RDW: 12.9 % (ref 11.7–15.4)
WBC: 5.1 10*3/uL (ref 3.4–10.8)

## 2019-06-04 LAB — MICROSCOPIC EXAMINATION
Bacteria, UA: NONE SEEN
Casts: NONE SEEN /lpf

## 2019-06-04 LAB — URINALYSIS, ROUTINE W REFLEX MICROSCOPIC
Bilirubin, UA: NEGATIVE
Glucose, UA: NEGATIVE
Ketones, UA: NEGATIVE
Nitrite, UA: NEGATIVE
Protein,UA: NEGATIVE
RBC, UA: NEGATIVE
Specific Gravity, UA: 1.023 (ref 1.005–1.030)
Urobilinogen, Ur: 0.2 mg/dL (ref 0.2–1.0)
pH, UA: 6 (ref 5.0–7.5)

## 2019-06-04 NOTE — Telephone Encounter (Signed)
-----   Message from Britt Bottom, MD sent at 06/04/2019  8:31 AM EDT ----- Please let the patient know that the blood count and UA looked ok   --- usually if UA is normal, there is no infection but if anything grows in the culture we will let her know

## 2019-06-04 NOTE — Telephone Encounter (Signed)
Called, LVM for pt (ok per DPR) about results per Dr. Felecia Shelling note. Gave [facility] phone number if she has further questions or concerns.

## 2019-06-05 LAB — URINE CULTURE

## 2019-06-09 NOTE — Telephone Encounter (Signed)
Called, LVM for pt letting her know I spoke with Dr. Felecia Shelling and UA culture looked ok. If she is still having urinary frequency, he can call in oxybutynin 5mg  tablet to take twice daily to see if this will help with her symptoms. Asked her to call back to let us know if she would like to try this

## 2019-06-09 NOTE — Telephone Encounter (Signed)
Pt returned call. Please call back when available. 

## 2019-06-10 MED ORDER — OXYBUTYNIN CHLORIDE 5 MG PO TABS: 5.0000 mg | ORAL_TABLET | Freq: Two times a day (BID) | ORAL | 3 refills | Status: DC

## 2019-06-10 NOTE — Addendum Note (Signed)
Addended by: Noberto Retort C on: 06/10/2019 11:24 AM   Modules accepted: Orders

## 2019-06-10 NOTE — Telephone Encounter (Signed)
I was able to speak to the patient and she would like to try the offered oxybutynin 5mg  BID.  She would like the prescription sent to [facility].

## 2019-06-11 ENCOUNTER — Telehealth: Payer: Self-pay | Admitting: *Deleted

## 2019-06-11 NOTE — Telephone Encounter (Signed)
Received fax with partial results from [facility]: JCV ab indeterminate, index: 0.22. Inhibition assay pending.

## 2019-06-11 NOTE — Telephone Encounter (Signed)
Called [facility] at [phone] and spoke with Ruth Anderson. JCV ab lab test drawn 06/03/2019 only has partial results available. Inhibition assay pending. She will fax me partial results to [phone].

## 2019-06-15 NOTE — Telephone Encounter (Signed)
Inhibition assay came back: negative

## 2019-06-22 ENCOUNTER — Other Ambulatory Visit: Payer: Self-pay | Admitting: Neurology

## 2019-06-23 NOTE — Telephone Encounter (Addendum)
[facility] [id number] to try and initiate PA. DOB in their system is [birth year] which does not match what we have 1971/06/10. I placed her on hold and called pt. Verified correct DOB 1970/10/27. I instructed pt she would need to call her insurance company directly to get this resolved ASAP prior to [place] being able to submit a PA for brand name Keppra. She can ask pharmacy to see if she can get temporary supply until this is resolved. They may be able to reimburse her once things get resolved. Pt verbalized understanding and will call her insurance today.   "PA for brand name Keppra 750mg  tablets #360/90 completed via Cover My Meds. Brand name is med. nec. b/c pt. has had breakthru sz. with generic. Last sz. while on generic med. resulted in intubation/ICU admission. Has had no breakthru sz. while compliant with brand name. PA approved for dates 04/14/18 thru 05/14/19. Case ID: [id number]./TMA"

## 2019-06-23 NOTE — Telephone Encounter (Signed)
Tried initiating PA 06/22/19 but unable to locate pt on CMM. Tried again this morning, same message. [facility] called [facility] to get [facility] pharmacy info: ID: [id number] RxBIN: [id number] PCN: [id number] Group: Ralene Cork

## 2019-06-24 ENCOUNTER — Other Ambulatory Visit: Payer: Self-pay | Admitting: Neurology

## 2019-06-24 NOTE — Telephone Encounter (Signed)
Called and spoke with husband on [place]. States they are in process of fixing her DOB. They have to update via his work and they requested her birth certificate in which they do not have. They had to go to courthouse to request but because of COVID-19 this department is closed and they have to request online. They did this today and it will be mailed to them. He is unsure when it will arrive. She has about a weeks worth of Keppra left. I recommended he call his work back to see if they can make an exception and expedite updating her DOB since she is low on medication and that they have requested her birth certificate. Advised insurance will not let me proceed with PA until her DOB is updated.  He wants to know if generic can be called in until this can be resolved and to call Brizeyda back to update her on things. He will let her know what we spoke about. Advised I will speak with MD and call her back.

## 2019-06-24 NOTE — Telephone Encounter (Signed)
Pt called in and wanted to the know the status of her PA for her Keppra

## 2019-06-24 NOTE — Telephone Encounter (Signed)
If there is going to be a problem with insurance in the short-term, we can call in the generic if she is going to run out soon.  We can just call in enough for 2 weeks which will hopefully be enough time

## 2019-06-24 NOTE — Telephone Encounter (Signed)
Dr. Sater- what would you recommend? 

## 2019-06-25 NOTE — Telephone Encounter (Signed)
I called the Ruth Anderson that Dr Felecia Shelling sent a generic form of keppra until she gets her birth date corrected with her insurance company. I stated the brand name cannot be done now. Ruth Anderson verbalized understanding and knows medication is there.

## 2019-07-01 ENCOUNTER — Other Ambulatory Visit: Payer: Self-pay

## 2019-07-01 MED ORDER — LEVETIRACETAM 750 MG PO TABS: 750.0000 mg | ORAL_TABLET | Freq: Two times a day (BID) | ORAL | 0 refills | Status: DC

## 2019-07-01 NOTE — Telephone Encounter (Signed)
Revised. 

## 2019-07-01 NOTE — Telephone Encounter (Addendum)
I called pt that generic keppra was sent to the pharmacy today. I stated PA for brand name keppra could not be done due to her birthdate still not being corrected. Pt stated she pick up the generic keppra last week. I stated she has another 30 day generic keppra on file that was sent today. Pt stated they call the insurance company and the birthday on the card should be change tomorrow. I stated to pt once its change to call us back so we can attempt another PA on the brand name. PT verbalized understanding and will contact us.She will remain on generic keppra now.

## 2019-07-01 NOTE — Telephone Encounter (Signed)
Tried to do brand name again on Keppra on cover my meds. They are still unable to find pt with her birthdate. Generic form keppra sent in today for 30 days.

## 2019-07-06 ENCOUNTER — Telehealth: Payer: Self-pay | Admitting: Neurology

## 2019-07-06 NOTE — Telephone Encounter (Signed)
Pt called wanting to speak to RN about her insurance and medication coverage. Please advise.

## 2019-07-06 NOTE — Telephone Encounter (Signed)
Called [facility] at [phone] to try and initiate urgent PA for Keppra 750mg  tablet. I was able to submit over the phone. PA approved 06/06/2019-07/05/2020. Case ID# [id number].   [facility] and spoke with Yomi. Informed them of PA approval info above. He verbalized understanding.  I called [facility] and spoke with Gean. She cx generic keppra sent to them on 07/01/2019.  Called pt back to let her know brand name approved via insurance and I spoke with [facility] to let them know as well. She verbalized understanding and appreciation.

## 2019-07-06 NOTE — Telephone Encounter (Signed)
Called pt back. She states she was able to update her DOB via insurance and we should be able to complete PA for brand name Keppra. Advised I tried submitting this am on CMM but it would not allow, still saying "pt not found". Advised I will call her insurance company to try and submit over the phone and will update pharmacy once we have determination. She verbalized understanding.

## 2019-07-08 ENCOUNTER — Telehealth: Payer: Self-pay | Admitting: *Deleted

## 2019-07-08 NOTE — Telephone Encounter (Addendum)
Submitted PA Lamictal on covermymeds. Key: [id number].  CLEXNT:[id number];WHQPRF:[id number];Review Type:Prior Auth;Coverage Start Date:06/08/2019;Coverage End Date:07/07/2020

## 2019-07-13 ENCOUNTER — Other Ambulatory Visit: Payer: Self-pay

## 2019-07-13 ENCOUNTER — Other Ambulatory Visit: Payer: Self-pay | Admitting: Neurology

## 2019-07-13 MED ORDER — KEPPRA 750 MG PO TABS: 1500.0000 mg | ORAL_TABLET | Freq: Two times a day (BID) | ORAL | 3 refills | Status: DC

## 2019-08-04 ENCOUNTER — Telehealth: Payer: Self-pay | Admitting: Neurology

## 2019-08-04 NOTE — Telephone Encounter (Signed)
Pt called in and stated that she turned in her disability paperwork , and then she was called back in office to correct some thing on the paperwork and she received her $50 payment back because Dr Felecia Shelling didn't have to fill out much, I advised her that there is no record of paperwork being received . Spoke with Terrence [place] she has received any paperwork. Pt states she will be in office tomorrow and everyone will know who she is

## 2019-09-10 ENCOUNTER — Other Ambulatory Visit: Payer: Self-pay

## 2019-09-10 ENCOUNTER — Ambulatory Visit: Payer: BC Managed Care – PPO | Admitting: Neurology

## 2019-09-10 ENCOUNTER — Telehealth: Payer: Self-pay | Admitting: *Deleted

## 2019-09-10 ENCOUNTER — Encounter: Payer: Self-pay | Admitting: Neurology

## 2019-09-10 ENCOUNTER — Telehealth: Payer: Self-pay

## 2019-09-10 VITALS — BP 121/79 | HR 77 | Temp 97.9°F | Ht 67.0 in | Wt 172.5 lb

## 2019-09-10 DIAGNOSIS — H532 Diplopia: Secondary | ICD-10-CM

## 2019-09-10 DIAGNOSIS — G40309 Generalized idiopathic epilepsy and epileptic syndromes, not intractable, without status epilepticus: Secondary | ICD-10-CM | POA: Diagnosis not present

## 2019-09-10 DIAGNOSIS — G35 Multiple sclerosis: Secondary | ICD-10-CM | POA: Diagnosis not present

## 2019-09-10 DIAGNOSIS — M21372 Foot drop, left foot: Secondary | ICD-10-CM

## 2019-09-10 DIAGNOSIS — Z79899 Other long term (current) drug therapy: Secondary | ICD-10-CM | POA: Diagnosis not present

## 2019-09-10 DIAGNOSIS — I878 Other specified disorders of veins: Secondary | ICD-10-CM

## 2019-09-10 DIAGNOSIS — Z95828 Presence of other vascular implants and grafts: Secondary | ICD-10-CM

## 2019-09-10 DIAGNOSIS — R269 Unspecified abnormalities of gait and mobility: Secondary | ICD-10-CM

## 2019-09-10 DIAGNOSIS — R5383 Other fatigue: Secondary | ICD-10-CM

## 2019-09-10 MED ORDER — VIMPAT 150 MG PO TABS: ORAL_TABLET | ORAL | 5 refills | Status: DC

## 2019-09-10 MED ORDER — PHENTERMINE HCL 37.5 MG PO TABS: 37.5000 mg | ORAL_TABLET | Freq: Every morning | ORAL | 5 refills | Status: DC

## 2019-09-10 NOTE — Telephone Encounter (Signed)
Patient called to report she has been having vision issues and states her vision gets blurry at times.   Please follow up

## 2019-09-10 NOTE — Telephone Encounter (Signed)
Placed JCV lab in [facility] lock box for routine lab pick up. Results pending. 

## 2019-09-10 NOTE — Telephone Encounter (Signed)
Called, LVM for pt on [phone] returning her call.   Called other number. Spoke with pt. Sx started a couple days ago, intermittent. Has not started any new meds. Denies any signs of infection. Last infusion about 3 weeks ago. Unsure when next infusion is. Husband tested negative for covid-19 yesterday. She denies signs/sx of covid-19 or being exposed to anyone with the virus. She knows to wear mask to appt and will check in around 10/10:15am.

## 2019-09-10 NOTE — Progress Notes (Signed)
[facility]  PATIENT: Ruth Anderson DOB: 1970-09-15  REFERRING DOCTOR OR PCP:  none SOURCE: patient, husband and records from [facility] neurology and images from [facility].  _________________________________   HISTORICAL  CHIEF COMPLAINT:  Chief Complaint  Patient presents with  . Follow-up    RM 12, alone. Last seen 05/2019. Here to be evaluated for blurry vision/dizziness that started a couple days ago.   . Multiple Sclerosis    On Tysabri. Last infusion 08/13/2019. Next one scheduled for 09/15/19 @ 8am per infusion suite. She denies any insurance changes with the new year.   . Vascular Access Problem    Pt reports she has port a cath that was placed years ago but has not been accessed recently. Wondering if she can be referred to see if this can be evaluated/determine if it is patent.    HISTORY OF PRESENT ILLNESS:  Ruth Anderson is a 49 year old woman with multiple sclerosis and epilepsy.     Update 09/10/2019: Her husband had Covid-19 and was admitted due to pneumonia.   He is back home.   She was tested earlier this week and was negative.     Her MS is stable for the most part.  She is on Tysabri and tolerates it well.   She has been JCV negative.   Her port has not worked lately.    Gait is ok but she is off balanced off flat surfaces.    She holds the bannister on stairs.   She has bladder urgency and rarely takes oxybutynin.   Visual acuity either eye is fine with glasses but she notes more diplopia and wonders if due to her Lamictal seizure medications.   She would like to switch to a different medication.     She has some fatigue and decreased focus/attention/verbal fluency/processing.   Both the fstigue and cognitive issues improved with phentermine.   She denies depression.      Update 06/03/2019:  She feels stable in general with no exacerbations.   She is on Tysabri and feels better than she did when she was on Tecfidera,   She tolerates  it well.   She notes more urinary urgency.   She has mild diplopia at times when she looks to the right but not left.   She notes it most when she drives.  She is walking the same---mildly off balanced but no falls.   She has had a few falls.    She has not had any seizures since 2014.    She is on Keppra and Lamictal and tolerates them well.   She has a lot of twitching (mostly with her neck/head that bothers her but no change in level of consciousness)  This is single jerk at a time.      She notes fatigue in the afternoons.  Phentermine is helping some.     She is noting some UTI's  Update 09/04/2018: She is noting more issues with her MS, specifically, she feels weaker on the right.  She is on Tecfidera and has tolerated it well.  However, she feels that she did much better when she was on Tysabri than when she was on Tecfidera.  She notes some tasks are harder to do.    Her gait is ok but she tires out easily and hte right foot drags when more tired.   She stumbles and falls every month or two.  No injuries lately.    She notes blurry vision when  she looks to the right.    She denies numbness or dysesthesia.    Bladder function is about the same with some hesitancy at times.  She notes double vision when she looks to the right.      Sleep is poor with trouble falling asleep and staying asleep.   She averages 5 hours/night but some nights only 4.  Occasionally, she has a night with 9-10 hours to catch up' .  She has fatigue that is better with phentermine and modafinil.   She notes some difficulty with focus and attention.     No recent seizures, last one ([facility]) was 2014.  She needs brand-name drugs due to breakthrough seizures.   Update 03/11/2018: Her MS is stable.    She tolerates Tecfidera well and has no recent exacerbation.    She is walking the same and has stumbles and rare falls.  She tripped over a cord 4 days ago and landed on the front right side, landing on hand and knee on concrete.    She has had lower back pain, midline, without radiation.   Pain is worse with standing up, bending over and with some activities and with coughing.    Pain is practically absent when sitting ir laying down.     She denies any new numbness or new weakness.    Bladder function is ok.   She has not tried any medication.      She denies any recent seizures.      Update 11/05/2017: She feels her MS has been stable.  She is on Tecfidera and she tolerates it well. She has not had any recent exacerbations.   MRI of the brain 11/04/2015 did not show any new lesions.    Her gait is mildly off balanced but she has no recent falls.  When tired, her left side is mildly weak  She has fatigue, mostly in the afternoon and early evenings     She usually goes to sleep around 8 am but sleeps poorly many nights and will often get out of bed for a while.   She sleeps in until 8 am.     Her mood is doing well.   She notes some cognitive issues with reduced focus and attention.  She also notes reduced memory and word finding abilities.  She denies any recent seizures. She continues on Keppra and lamotrigine.   From 05/07/2017:  MS:  She Was on Tecfidera but did not get her last order shipped to her. We discussed the importance of getting back on therapy. She does not think she had any exacerbations while she was on it and she tolerates it well.  In the past, I compare the MRI dated 11/04/2015 and to  the MRI from 11/25/2012. There was no definite change in the interim  Gait/strength/sensation:    Her gait is doing about the same. She stumbles but rarely has a fall. Generally her gait is worse when she is more tired or she is hot.  She notes left > right leg weakness with foot drop, worse when tired. She notes mild proximal left arm weakness. She has numbness in her hands.    No dysesthetic pain.  Bladder:   Bladder function is doing well. She has mild frequency and urgency but no incontinence. She had a urinary tract  infection earlier this year..   She denies any issues with bowel.      She prefers not to take any more med's.  Vision: She notes mild vision changes that have been mostly stable this year. When she looks to the right she sometimes has double vision, especially when she is tired.   Fatigue/sleep: Fatigue is worse with heat and she gets tired quickly.   She has issues with both mental and physical fatigue.   Her fatigue did better on the Tysabri than on Tecfidera. Provigil and phentermine helps the fatigue some. She feels the quality of her sleep is good most nights but she sometimes has trouble quieting her mind.    Mood/cognition:   She notes mild depression.   Her sister passed away (MI 09-Mar-2013) and she still sometimes feels down.  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    She notes problems with attention, memory, processing speed and word finding.   Provigil helps some.   In the past, she was on Ritalin with benefit and better attention/focus.     Seizure:    Her last seizure was in 2014, shortly after her sister died and she needed intubation.   Her first seizure was in 1998 and they have been triggered by flashing Christmas tree lights. She has had about a dozen seizures overall with most of them occurring out of sleep but with a couple of them occurring during the daytime with severe ictal grogginess. For the most part, the seizures have been controlled on Lamictal 200 mg 3 times a day and Keppra 1500 mg twice a day.    Of note, she had breakthrough seizure on generic so we have been writing for the brand.      MS History:  She was diagnosed with MS in 1988 placed on Betaseron when it became available.  Later on she switched to Copaxone and Avonex. When Tysabri became available in 2006, she switched and had 70 infusions over the next 6  years (monthly except for a 6 month holiday).   She tolerated Tysabri well, was stable and was JCV antibody negative. However, she  did not like having to do the monthly to hour infusions. In May 2013, she started Tecfidera. She had flushing that persisted after many months but did not have significant GI issues. I last saw her on 07/29/2014 and she reported that her MS remains stable on the Tecfidera. I personally reviewed her MRI of the brain from 2015. It was compared to an MRI dated 12/12/2009. She has signal matter signal changes in the hemispheres consistent with multiple sclerosis. Many of these are radially oriented to the lateral ventricles in the corpus callosum and others are white matter. Brain stem appears normal. There were no acute findings and there was no significant changes when compared to the MRI dated 12/12/2009. Vitamin D was 32.9 in July 2015. Lymphocyte count was 0.8 on 07/29/2014.   REVIEW OF SYSTEMS: Constitutional: No fevers, chills, sweats, or change in appetite.  Notes some fatigue Eyes: as above.  No eye pain Ear, nose and throat: No hearing loss, ear pain, nasal congestion, sore throat Cardiovascular: No chest pain, palpitations Respiratory: No shortness of breath at rest or with exertion.   No wheezes GastrointestinaI: No nausea, vomiting, diarrhea, abdominal pain, fecal incontinence Genitourinary: No dysuria, urinary retention or frequency.  No nocturia. Musculoskeletal: No neck pain, back pain Integumentary: No rash, pruritus, skin lesions Neurological: as above Psychiatric: No depression at this time.  No anxiety Endocrine: No palpitations, diaphoresis, change in appetite, change in weigh or increased thirst Hematologic/Lymphatic: No anemia, purpura,  petechiae. Allergic/Immunologic: No itchy/runny eyes, nasal congestion, recent allergic reactions, rashes  ALLERGIES: No Known Allergies  HOME MEDICATIONS:  Current Outpatient Medications:  .  ibuprofen (ADVIL) 800 MG tablet, TAKE 1 TABLET BY MOUTH 3 TIMES DAILY WITH FOOD AS NEEDED FOR PAIN & INFLAMMATION, Disp: 270 tablet, Rfl: 0 .   KEPPRA 750 MG tablet, Take 2 tablets (1,500 mg total) by mouth 2 (two) times daily., Disp: 360 tablet, Rfl: 3 .  LAMICTAL 200 MG tablet, TAKE 1 TABLET THREE TIMES A DAY FOR SEIZURE DISORDER, Disp: 270 tablet, Rfl: 3 .  natalizumab (TYSABRI) 300 MG/15ML injection, Inject into the vein., Disp: , Rfl:  .  oxybutynin (DITROPAN) 5 MG tablet, Take 1 tablet (5 mg total) by mouth 2 (two) times daily., Disp: 180 tablet, Rfl: 3 .  phentermine (ADIPEX-P) 37.5 MG tablet, Take 1 tablet (37.5 mg total) by mouth every morning., Disp: 30 tablet, Rfl: 5 .  VITAMIN D PO, Take 5,000 Units by mouth daily., Disp: , Rfl:  .  Lacosamide (VIMPAT) 150 MG TABS, One po bid, Disp: 60 tablet, Rfl: 5 No current facility-administered medications for this visit.  Facility-Administered Medications Ordered in Other Visits:  .  gadopentetate dimeglumine (MAGNEVIST) injection 17 mL, 17 mL, Intravenous, Once PRN, Heiress Williamson, Pearletha Furl, MD  PAST MEDICAL HISTORY: Past Medical History:  Diagnosis Date  . Multiple sclerosis (HCC)   . Seizures (HCC)   . Vision abnormalities     PAST SURGICAL HISTORY: History reviewed. No pertinent surgical history.  FAMILY HISTORY: Family History  Problem Relation Age of Onset  . Hypertension Mother   . Heart disease Father   . Hypertension Father   . Stroke Father   . Diabetes type II Father     SOCIAL HISTORY:  Social History   Socioeconomic History  . Marital status: Married    Spouse name: Not on file  . Number of children: Not on file  . Years of education: Not on file  . Highest education level: Not on file  Occupational History  . Not on file  Tobacco Use  . Smoking status: Former Games developer  . Smokeless tobacco: Never Used  Substance and Sexual Activity  . Alcohol use: No    Alcohol/week: 0.0 standard drinks  . Drug use: No  . Sexual activity: Not on file  Other Topics Concern  . Not on file  Social History Narrative  . Not on file   Social Determinants of Health    Financial Resource Strain:   . Difficulty of Paying Living Expenses: Not on file  Food Insecurity:   . Worried About Programme researcher, broadcasting/film/video in the Last Year: Not on file  . Ran Out of Food in the Last Year: Not on file  Transportation Needs:   . Lack of Transportation (Medical): Not on file  . Lack of Transportation (Non-Medical): Not on file  Physical Activity:   . Days of Exercise per Week: Not on file  . Minutes of Exercise per Session: Not on file  Stress:   . Feeling of Stress : Not on file  Social Connections:   . Frequency of Communication with Friends and Family: Not on file  . Frequency of Social Gatherings with Friends and Family: Not on file  . Attends Religious Services: Not on file  . Active Member of Clubs or Organizations: Not on file  . Attends Banker Meetings: Not on file  . Marital Status: Not on file  Intimate Partner Violence:   .  Fear of Current or Ex-Partner: Not on file  . Emotionally Abused: Not on file  . Physically Abused: Not on file  . Sexually Abused: Not on file     PHYSICAL EXAM  Vitals:   09/10/19 1018  BP: 121/79  Pulse: 77  Temp: 97.9 F (36.6 C)  Weight: 172 lb 8 oz (78.2 kg)  Height: 5\' 7"  (1.702 m)    Body mass index is 27.02 kg/m.   General: The patient is well-developed and well-nourished and in no acute distress  Musculoskeletal: She is tender over L4-L5 midline   Neurologic Exam  Mental status: The patient is alert and oriented x 3 at the time of the examination. The patient has apparent normal recent and remote memory, with an apparently normal attention span and concentration ability.   Speech is normal.  Cranial nerves: She noted mild diplopia but I could not see a dysconjugate gaze looking up to the right.  There is no nystagmus.  Visual acuity is symmetric. Facial strength and sensation are normal. Trapezius strength is normal..   No obvious hearing deficits are noted.  Motor:  Muscle bulk is normal.   She has mildly increased muscle tone in the legs, left greater than right.  . Strength is  5 / 5 in the arms but has slight left foot weakness.  Sensory:   She has normal sensation to touch and vibration.  Coordination: Cerebellar testing reveals good finger-nose-finger and heel-to-shin bilaterally.  Gait and station: Station is normal.   The gait is mildly spastic on the left more than the right.  The gait is mildly wide.  Mild left foot drop and tandem gait is poor. Romberg is negative.   Reflexes: Deep tendon reflexes are symmetric and brisk bilaterally.        DIAGNOSTIC DATA (LABS, IMAGING, TESTING) - I reviewed patient records, labs, notes, testing and imaging myself where available.     ASSESSMENT AND PLAN  Multiple sclerosis ([place]) - Plan: Ambulatory referral to Interventional Radiology, Stratify JCV Antibody Test ([facility]), CBC with Differential/Platelets  Poor venous access - Plan: Ambulatory referral to Interventional Radiology  High risk medication use - Plan: Ambulatory referral to Interventional Radiology, Stratify JCV Antibody Test ([facility]), CBC with Differential/Platelets  Port-A-Cath in place - Plan: Ambulatory referral to Interventional Radiology  Epilepsy, generalized, convulsive ([place])  Other fatigue  Left foot drop  Diplopia  Gait disturbance    1. Continue Tysabri.    Check JCV and CBC.   Refer to vascular for new port.  2.  Continue Keppra   (brand necessary as she had a severe breakthrough seizure while on generic requiring intubation).   We discussed switching the lamotrigine to VImpat as she has had some visual changes since the lamotrigine was started.     I gave samples of Vimpat 100 mg (#14 Lot [id number]  10/2021) and will call in 150 mg bid 3.  Continue phentermine.  She stopped modafinil 4.   Continue prn oxybutynin 5    She will return to see me in 6 months will sooner if she has new or worsening neurologic symptoms.   Reneisha Stilley A. Felecia Shelling, MD, PhD  11/17/4008, 27:25 AM Certified in Neurology, Clinical Neurophysiology, Sleep Medicine, Pain Medicine and Neuroimaging  [facility] [address] [place], [place] [address] [phone]

## 2019-09-11 ENCOUNTER — Other Ambulatory Visit (HOSPITAL_COMMUNITY): Payer: Self-pay | Admitting: Neurology

## 2019-09-11 ENCOUNTER — Telehealth (HOSPITAL_COMMUNITY): Payer: Self-pay | Admitting: Radiology

## 2019-09-11 ENCOUNTER — Other Ambulatory Visit: Payer: Self-pay | Admitting: Physician Assistant

## 2019-09-11 DIAGNOSIS — G35 Multiple sclerosis: Secondary | ICD-10-CM

## 2019-09-11 LAB — CBC WITH DIFFERENTIAL/PLATELET
Basophils Absolute: 0 10*3/uL (ref 0.0–0.2)
Basos: 1 %
EOS (ABSOLUTE): 0.1 10*3/uL (ref 0.0–0.4)
Eos: 3 %
Hematocrit: 38.7 % (ref 34.0–46.6)
Hemoglobin: 12.9 g/dL (ref 11.1–15.9)
Immature Grans (Abs): 0 10*3/uL (ref 0.0–0.1)
Immature Granulocytes: 0 %
Lymphocytes Absolute: 2.5 10*3/uL (ref 0.7–3.1)
Lymphs: 59 %
MCH: 30.5 pg (ref 26.6–33.0)
MCHC: 33.3 g/dL (ref 31.5–35.7)
MCV: 92 fL (ref 79–97)
Monocytes Absolute: 0.2 10*3/uL (ref 0.1–0.9)
Monocytes: 6 %
Neutrophils Absolute: 1.3 10*3/uL — ABNORMAL LOW (ref 1.4–7.0)
Neutrophils: 31 %
Platelets: 339 10*3/uL (ref 150–450)
RBC: 4.23 x10E6/uL (ref 3.77–5.28)
RDW: 12.6 % (ref 11.7–15.4)
WBC: 4.2 10*3/uL (ref 3.4–10.8)

## 2019-09-11 NOTE — Telephone Encounter (Signed)
Unable to reach pt. Call and left VM with her appt info for Monday 09/14/19 for port eval and possible replacement JM

## 2019-09-14 ENCOUNTER — Other Ambulatory Visit (HOSPITAL_COMMUNITY): Payer: Self-pay | Admitting: Neurology

## 2019-09-14 ENCOUNTER — Other Ambulatory Visit: Payer: Self-pay

## 2019-09-14 ENCOUNTER — Ambulatory Visit (HOSPITAL_COMMUNITY)
Admission: RE | Admit: 2019-09-14 | Discharge: 2019-09-14 | Disposition: A | Discharge status: Home / Self Care | Payer: BC Managed Care – PPO | Source: Ambulatory Visit | Attending: Physician Assistant | Admitting: Physician Assistant

## 2019-09-14 ENCOUNTER — Ambulatory Visit (HOSPITAL_COMMUNITY)
Admission: RE | Admit: 2019-09-14 | Discharge: 2019-09-14 | Disposition: A | Discharge status: Home / Self Care | Payer: BC Managed Care – PPO | Source: Ambulatory Visit | Attending: Neurology | Admitting: Neurology

## 2019-09-14 ENCOUNTER — Encounter (HOSPITAL_COMMUNITY): Payer: Self-pay

## 2019-09-14 DIAGNOSIS — Z452 Encounter for adjustment and management of vascular access device: Secondary | ICD-10-CM | POA: Insufficient documentation

## 2019-09-14 DIAGNOSIS — Z95828 Presence of other vascular implants and grafts: Secondary | ICD-10-CM

## 2019-09-14 DIAGNOSIS — Z79899 Other long term (current) drug therapy: Secondary | ICD-10-CM | POA: Insufficient documentation

## 2019-09-14 DIAGNOSIS — G35 Multiple sclerosis: Secondary | ICD-10-CM | POA: Diagnosis not present

## 2019-09-14 DIAGNOSIS — R569 Unspecified convulsions: Secondary | ICD-10-CM | POA: Insufficient documentation

## 2019-09-14 DIAGNOSIS — Z87891 Personal history of nicotine dependence: Secondary | ICD-10-CM | POA: Insufficient documentation

## 2019-09-14 HISTORY — PX: IR REMOVAL TUN ACCESS W/ PORT W/O FL MOD SED: IMG2290

## 2019-09-14 HISTORY — PX: IR IMAGING GUIDED PORT INSERTION: IMG5740

## 2019-09-14 LAB — PROTIME-INR
INR: 1 (ref 0.8–1.2)
Prothrombin Time: 13.1 seconds (ref 11.4–15.2)

## 2019-09-14 LAB — CBC
HCT: 38.7 % (ref 36.0–46.0)
Hemoglobin: 12.1 g/dL (ref 12.0–15.0)
MCH: 30.3 pg (ref 26.0–34.0)
MCHC: 31.3 g/dL (ref 30.0–36.0)
MCV: 96.8 fL (ref 80.0–100.0)
Platelets: 284 10*3/uL (ref 150–400)
RBC: 4 MIL/uL (ref 3.87–5.11)
RDW: 12.2 % (ref 11.5–15.5)
WBC: 4.1 10*3/uL (ref 4.0–10.5)
nRBC: 0 % (ref 0.0–0.2)

## 2019-09-14 MED ORDER — FENTANYL CITRATE (PF) 100 MCG/2ML IJ SOLN
INTRAMUSCULAR | Status: AC | PRN
  Administered 2019-09-14: 25 ug via INTRAVENOUS
  Administered 2019-09-14: 50 ug via INTRAVENOUS
  Administered 2019-09-14: 25 ug via INTRAVENOUS

## 2019-09-14 MED ORDER — SODIUM CHLORIDE 0.9 % IV SOLN: INTRAVENOUS | Status: DC

## 2019-09-14 MED ORDER — MIDAZOLAM HCL 2 MG/2ML IJ SOLN
INTRAMUSCULAR | Status: AC | PRN
  Administered 2019-09-14 (×2): 1 mg via INTRAVENOUS

## 2019-09-14 MED ORDER — FENTANYL CITRATE (PF) 100 MCG/2ML IJ SOLN
INTRAMUSCULAR | Status: AC
  Filled 2019-09-14: qty 4

## 2019-09-14 MED ORDER — MIDAZOLAM HCL 2 MG/2ML IJ SOLN
INTRAMUSCULAR | Status: AC
  Filled 2019-09-14: qty 4

## 2019-09-14 MED ORDER — CEFAZOLIN SODIUM-DEXTROSE 2-4 GM/100ML-% IV SOLN
INTRAVENOUS | Status: AC
  Filled 2019-09-14: qty 100

## 2019-09-14 MED ORDER — LIDOCAINE HCL 1 % IJ SOLN
INTRAMUSCULAR | Status: AC
  Filled 2019-09-14: qty 20

## 2019-09-14 MED ORDER — CEFAZOLIN SODIUM-DEXTROSE 2-4 GM/100ML-% IV SOLN
2.0000 g | Freq: Once | INTRAVENOUS | Status: AC
  Administered 2019-09-14: 2 g via INTRAVENOUS

## 2019-09-14 MED ORDER — HEPARIN SOD (PORK) LOCK FLUSH 100 UNIT/ML IV SOLN
INTRAVENOUS | Status: AC | PRN
  Administered 2019-09-14: 500 [IU] via INTRAVENOUS

## 2019-09-14 MED ORDER — LIDOCAINE HCL 1 % IJ SOLN
INTRAMUSCULAR | Status: AC | PRN
  Administered 2019-09-14: 15 mL

## 2019-09-14 MED ORDER — HEPARIN SOD (PORK) LOCK FLUSH 100 UNIT/ML IV SOLN
INTRAVENOUS | Status: AC
  Filled 2019-09-14: qty 5

## 2019-09-14 NOTE — Procedures (Signed)
Interventional Radiology Procedure:   Indications: MS with poor venous access and old port that is not functioning.  Procedure: Port removal and new port placement  Findings: Old port was removed without complication.  New Right IJ port, tip at SVC/RA junction.  Complications: None     EBL: less than 20 ml  Plan: Discharge to home.  Keep incisions dry for 24 hours and do not submerge in water for 1 week.     Ruth Anderson R. Lowella Dandy, MD  Pager: [phone]

## 2019-09-14 NOTE — Discharge Instructions (Signed)

## 2019-09-14 NOTE — H&P (Signed)
Chief Complaint: Malfunctioning Port A Cath  Referring Physician(s): Sater,Richard A  Supervising Physician: Markus Daft  Patient Status: [facility]  History of Present Illness: Ruth Anderson is a 49 y.o. female with long standing multiple sclerosis and poor venous access.  She had her Port place sometime around 2010.  She states she has not had to use in years.  She tells me she is going to be starting "infusions" and needs a functional Port A Cath.  She is NPO. No blood thinners.  No nausea/vomiting. No Fever/chills. ROS negative.   Past Medical History:  Diagnosis Date  . Multiple sclerosis ([place])   . Seizures ([place])   . Vision abnormalities     No past surgical history on file.  Allergies: Patient has no known allergies.  Medications: Prior to Admission medications   Medication Sig Start Date End Date Taking? Authorizing Provider  ibuprofen (ADVIL) 800 MG tablet TAKE 1 TABLET BY MOUTH 3 TIMES DAILY WITH FOOD AS NEEDED FOR PAIN & INFLAMMATION 05/21/19   Sater, Nanine Means, MD  KEPPRA 750 MG tablet Take 2 tablets (1,500 mg total) by mouth 2 (two) times daily. 07/13/19   Sater, Nanine Means, MD  Lacosamide (VIMPAT) 150 MG TABS One po bid 09/10/19   Sater, Nanine Means, MD  LAMICTAL 200 MG tablet TAKE 1 TABLET THREE TIMES A DAY FOR SEIZURE DISORDER 04/13/19   Sater, Nanine Means, MD  natalizumab (TYSABRI) 300 MG/15ML injection Inject into the vein.    [provider]  oxybutynin (DITROPAN) 5 MG tablet Take 1 tablet (5 mg total) by mouth 2 (two) times daily. 06/10/19   Sater, Nanine Means, MD  phentermine (ADIPEX-P) 37.5 MG tablet Take 1 tablet (37.5 mg total) by mouth every morning. 09/10/19   Sater, Nanine Means, MD  VITAMIN D PO Take 5,000 Units by mouth daily.    [provider]     Family History  Problem Relation Age of Onset  . Hypertension Mother   . Heart disease Father   . Hypertension Father   . Stroke Father   . Diabetes type II Father      Social History   Socioeconomic History  . Marital status: Married    Spouse name: Not on file  . Number of children: Not on file  . Years of education: Not on file  . Highest education level: Not on file  Occupational History  . Not on file  Tobacco Use  . Smoking status: Former Research scientist (life sciences)  . Smokeless tobacco: Never Used  Substance and Sexual Activity  . Alcohol use: No    Alcohol/week: 0.0 standard drinks  . Drug use: No  . Sexual activity: Not on file  Other Topics Concern  . Not on file  Social History Narrative  . Not on file   Social Determinants of Health   Financial Resource Strain:   . Difficulty of Paying Living Expenses: Not on file  Food Insecurity:   . Worried About Charity fundraiser in the Last Year: Not on file  . Ran Out of Food in the Last Year: Not on file  Transportation Needs:   . Lack of Transportation (Medical): Not on file  . Lack of Transportation (Non-Medical): Not on file  Physical Activity:   . Days of Exercise per Week: Not on file  . Minutes of Exercise per Session: Not on file  Stress:   . Feeling of Stress : Not on file  Social Connections:   .  Frequency of Communication with Friends and Family: Not on file  . Frequency of Social Gatherings with Friends and Family: Not on file  . Attends Religious Services: Not on file  . Active Member of Clubs or Organizations: Not on file  . Attends Banker Meetings: Not on file  . Marital Status: Not on file     Review of Systems: A 12 point ROS discussed and pertinent positives are indicated in the HPI above.  All other systems are negative.  Review of Systems  Vital Signs: BP 119/78   Pulse 68   Temp 98 F (36.7 C) (Oral)   Resp 18   Ht 5\' 8"  (1.727 m)   Wt 79.4 kg   SpO2 100%   BMI 26.61 kg/m   Physical Exam Vitals reviewed.  Constitutional:      Appearance: Normal appearance.  HENT:     Head: Normocephalic and atraumatic.  Eyes:     Extraocular Movements:  Extraocular movements intact.  Cardiovascular:     Rate and Rhythm: Normal rate and regular rhythm.  Pulmonary:     Effort: Pulmonary effort is normal. No respiratory distress.     Breath sounds: Normal breath sounds.  Abdominal:     General: There is no distension.     Palpations: Abdomen is soft.     Tenderness: There is no abdominal tenderness.  Musculoskeletal:        General: Normal range of motion.  Skin:    General: Skin is warm and dry.  Neurological:     General: No focal deficit present.     Mental Status: She is alert and oriented to person, place, and time.  Psychiatric:        Mood and Affect: Mood normal.        Behavior: Behavior normal.        Thought Content: Thought content normal.        Judgment: Judgment normal.     Imaging: DG Chest Port 1 View  Result Date: 09/14/2019 CLINICAL DATA:  Evaluate status of porta catheter EXAM: PORTABLE CHEST 1 VIEW COMPARISON:  None FINDINGS: Right subclavian port a catheter is identified with tip at the cavoatrial junction. The heart size appears normal. No pleural effusion or edema. No airspace opacities identified. IMPRESSION: No acute cardiopulmonary abnormalities. Electronically Signed   By: 09/16/2019 M.D.   On: 09/14/2019 09:09    Labs:  CBC: Recent Labs    06/03/19 1341 09/10/19 1056  WBC 5.1 4.2  HGB 12.3 12.9  HCT 36.7 38.7  PLT 257 339    COAGS: No results for input(s): INR, APTT in the last 8760 hours.  BMP: No results for input(s): NA, K, CL, CO2, GLUCOSE, BUN, CALCIUM, CREATININE, GFRNONAA, GFRAA in the last 8760 hours.  Invalid input(s): CMP  LIVER FUNCTION TESTS: No results for input(s): BILITOT, AST, ALT, ALKPHOS, PROT, ALBUMIN in the last 8760 hours.  TUMOR MARKERS: No results for input(s): AFPTM, CEA, CA199, CHROMGRNA in the last 8760 hours.  Assessment and Plan:  Multiple sclerosis  Poor venous access. Need for exchange of Port A Cath to begin infusion therapy.  Risks and  benefits of image guided port-a-catheter placement was discussed with the patient including, but not limited to bleeding, infection, pneumothorax, or fibrin sheath development and need for additional procedures.  All of the patient's questions were answered, patient is agreeable to proceed. Consent signed and in chart.  Thank you for this interesting consult.  I greatly enjoyed  meeting Ruth Anderson and look forward to participating in their care.  A copy of this report was sent to the requesting provider on this date.  Electronically Signed: Gwynneth Macleod, PA-C   09/14/2019, 9:16 AM      I spent a total of  30 Minutes  in face to face in clinical consultation, greater than 50% of which was counseling/coordinating care for placement of a Port A Cath.

## 2019-09-15 ENCOUNTER — Telehealth: Payer: Self-pay | Admitting: Neurology

## 2019-09-15 DIAGNOSIS — G35 Multiple sclerosis: Secondary | ICD-10-CM

## 2019-09-15 NOTE — Telephone Encounter (Signed)
Pt stated that she would like to ask questions about her seizure medication, stating that she is not reacting well to it. She also asked that we wait about an hour to contact her as she needs to get home to her phone. Please advise.

## 2019-09-15 NOTE — Telephone Encounter (Signed)
I called pt back. States Vimpat made her vision worse so she stopped and went back on Lamictal on Friday. She is still having same vision issues. Wondering what Dr. Epimenio Foot recommends at this point.  She has her infusion today. She had port-a-cath placed yesterday. I

## 2019-09-15 NOTE — Telephone Encounter (Signed)
Called pt. Relayed Dr. Bonnita Hollow recommendation. She verbalized understanding and is agreeable to the plan. I recommended she call to make eye appt today. I will place new order for MRI brain and she will be called to get this scheduled.

## 2019-09-15 NOTE — Telephone Encounter (Signed)
Please let her know that for now, she should continue on the lamotrigine.  If she has not seen ophthalmology in the last 6 months she should do so.  I ordered an MRI of the brain at the last visit..  It does not look like it has been scheduled yet.  Could you please check with the schedulers about this.    Thank you

## 2019-09-15 NOTE — Addendum Note (Signed)
Addended by: Eilene Ghazi L on: 09/15/2019 11:42 AM   Modules accepted: Orders

## 2019-09-21 NOTE — Telephone Encounter (Signed)
JCV ab drawn on 09/10/2019 indeterminate, index: 0.21. Inhibition assay: negative.

## 2019-09-29 ENCOUNTER — Telehealth: Payer: Self-pay | Admitting: Neurology

## 2019-09-29 NOTE — Telephone Encounter (Signed)
[employer] Auth: NPR Ref # [id number] spoke to [facility] D order faxed to [facility] they will reach out to the patient to schedule. Phone number is [phone] fax # [phone].

## 2019-10-05 ENCOUNTER — Other Ambulatory Visit: Payer: Self-pay | Admitting: Neurology

## 2019-10-06 ENCOUNTER — Other Ambulatory Visit: Payer: Self-pay | Admitting: Neurology

## 2019-10-06 ENCOUNTER — Telehealth: Payer: Self-pay

## 2019-10-06 MED ORDER — MODAFINIL 200 MG PO TABS: 200.0000 mg | ORAL_TABLET | Freq: Every day | ORAL | 5 refills | Status: DC

## 2019-10-06 NOTE — Telephone Encounter (Signed)
Patient called stating she doesn't understand why her medication refill is being denied. Informed patient an apt needs to be scheduled in order to submit a refill request and the patient stated why does she need to do that if she just saw provider not too long ago and requested RN call back..  Please follow up

## 2019-10-06 NOTE — Telephone Encounter (Signed)
I called pt back. Advised refill actually denied because at her visit on 09/10/19 with Dr. Epimenio Foot, she reported she was taking phentermine, not modafinil. Pt states she actually told him wrong and she is taking modafinil. I checked drug registry. She last refilled modafinil 09/08/19 #60. Advised I will send message to Dr. Epimenio Foot to see if he is okay with sending in rx for modafinil.   She also asked about her ongoing vision issues. She has not gone to see ophthalmologist as previously recommended. She will call to make an appt.

## 2019-10-07 ENCOUNTER — Other Ambulatory Visit: Payer: Self-pay | Admitting: *Deleted

## 2019-10-07 NOTE — Telephone Encounter (Signed)
PA modafinil submitted on CMM. Key: [id number]. Received instant approval.  [id number];Review Type:Prior Auth;Coverage Start Date:09/07/2019;Coverage End Date:10/06/2020;

## 2019-10-15 ENCOUNTER — Telehealth: Payer: Self-pay | Admitting: Neurology

## 2019-10-15 NOTE — Telephone Encounter (Signed)
The patient was given baclofen by another physician for her back problems.  She is concerned about her history of seizures.  I indicated that she may take the baclofen, but if she is on a regular dose for a period of time she is not to stop the medication suddenly as this may increase the risk for seizures.

## 2019-11-02 ENCOUNTER — Telehealth: Payer: Self-pay | Admitting: *Deleted

## 2019-11-02 NOTE — Telephone Encounter (Signed)
Received fax from touch prescribing program that pt re-authorized for Tysabri from 11/02/2019-06/02/2020. Pt enrollment number: [id number]. Account: [employer]. Site auth number: [id number].

## 2019-11-02 NOTE — Telephone Encounter (Signed)
Faxed completed/signed Tysabri pt status report and reauth questionnaire to [facility] at [phone]. Received confirmation.  

## 2019-11-11 ENCOUNTER — Telehealth: Payer: Self-pay | Admitting: Neurology

## 2019-11-11 NOTE — Telephone Encounter (Signed)
Pt called requesting a CB from RN in regards to muscle relaxers and to verify how much she should be taking as they have not been helping so much

## 2019-11-11 NOTE — Telephone Encounter (Signed)
Called pt back. Relayed Dr. Bonnita Hollow recommendation. She said she also got diclofenac 75mg  po bid from PCP. She did not mention this previously. She is going to try diclofenac and methocarbamol first to see if this improves sx. We will hold on sending etodolac. She is aware not to take any other nsaids with diclofenac. She will call back if sx do not improve. Nothing further needed.

## 2019-11-11 NOTE — Telephone Encounter (Signed)
Spoke with Dr. Epimenio Foot. He recommends trying her on etodolac 400mg  po BID. She should not take any other NSAIDS with this. She should also pick up methocarbamol that PCP called in and try this.

## 2019-11-11 NOTE — Telephone Encounter (Signed)
Called pt. She has been having severe back pain. Placed on baclofen 10mg  by (orthopaedic). Placed on this three weeks ago but she just started taking this. She took 1 tablet in the am and 2 tablets later today (could not give time) and said it was ineffective. Ortho ordered PT but this was ineffective per pt. She went to PCP who prescribed something but she has not picked this up yet from pharmacy. Did not know name. I checked epic and appears they called in methocarbamol 750mg  po qid. She wants to know what Dr. Buren Kos recommends prior to picking this up.  She has not completed MRI brain that was ordered. States she never heard about getting scheduled. I reviewed and order was sent to [facility]. Advised I will have our MRI coordinator f/u on this.  Pt reports she saw ophthalmology who stated everything looked okay. She has been trying to get off lamictal. Only taking at bed now.

## 2019-11-11 NOTE — Telephone Encounter (Signed)
Spoke to the patient and informed her that I faxed the MRI order to [facility]. She stated she has not heard from them. I informed her I will fax the order again and if she does not heard from them by the end of the week to give them a call. She understood..  She also wanted to know about muscle relaxer for her back.

## 2019-11-16 DIAGNOSIS — M47816 Spondylosis without myelopathy or radiculopathy, lumbar region: Secondary | ICD-10-CM | POA: Insufficient documentation

## 2019-11-30 ENCOUNTER — Telehealth: Payer: Self-pay | Admitting: Neurology

## 2019-11-30 MED ORDER — METHOCARBAMOL 750 MG PO TABS: 750.0000 mg | ORAL_TABLET | Freq: Four times a day (QID) | ORAL | 2 refills | Status: DC

## 2019-11-30 NOTE — Telephone Encounter (Signed)
Patient called requesting a CB from RN to discuss back issues

## 2019-11-30 NOTE — Telephone Encounter (Signed)
Called pt back. States her back has started to bother her again. Has been taking diclofenac, ran out of methocarbamol last week.  She has not f/u with orthopaedics. Was told not to f/u unless meds not helping. She states medication has helped. Wanting refill on methocarbamol. Dr. Epimenio Foot approved. I escribed to [facility] for her. She also asked for refill on modafinil. Advised rx last sent 09/2019 with 5 refills. She should contact pharmacy for refill.  Also scheduled her a f/u for 03/10/20 at 1pm with Dr. Epimenio Foot. She did not have one scheduled.

## 2019-12-02 ENCOUNTER — Ambulatory Visit: Payer: BLUE CROSS/BLUE SHIELD | Admitting: Neurology

## 2019-12-11 ENCOUNTER — Other Ambulatory Visit: Payer: Self-pay | Admitting: Neurology

## 2019-12-14 ENCOUNTER — Telehealth: Payer: Self-pay | Admitting: *Deleted

## 2019-12-14 NOTE — Telephone Encounter (Signed)
-----   Message from Asa Lente, MD sent at 12/11/2019 11:58 AM EDT ----- Regarding: mri She had an MRI in [facility].   I compared with her last one.  Please let her know the MRi showed no new lesions

## 2019-12-14 NOTE — Telephone Encounter (Signed)
Called and spoke with pt about MRI results per Dr. Epimenio Foot note. Pt verbalized understanding. Advised her to keep her next scheduled follow up in July. She had no further questions.

## 2020-01-27 ENCOUNTER — Other Ambulatory Visit: Payer: Self-pay | Admitting: Neurology

## 2020-01-28 ENCOUNTER — Other Ambulatory Visit: Payer: Self-pay | Admitting: Neurology

## 2020-02-22 ENCOUNTER — Other Ambulatory Visit: Payer: Self-pay | Admitting: Neurology

## 2020-03-10 ENCOUNTER — Ambulatory Visit (INDEPENDENT_AMBULATORY_CARE_PROVIDER_SITE_OTHER): Payer: BC Managed Care – PPO | Admitting: Neurology

## 2020-03-10 ENCOUNTER — Other Ambulatory Visit: Payer: Self-pay

## 2020-03-10 ENCOUNTER — Telehealth: Payer: Self-pay | Admitting: *Deleted

## 2020-03-10 VITALS — BP 120/74 | HR 73 | Ht 68.0 in

## 2020-03-10 DIAGNOSIS — M21372 Foot drop, left foot: Secondary | ICD-10-CM

## 2020-03-10 DIAGNOSIS — Z79899 Other long term (current) drug therapy: Secondary | ICD-10-CM | POA: Diagnosis not present

## 2020-03-10 DIAGNOSIS — H532 Diplopia: Secondary | ICD-10-CM

## 2020-03-10 DIAGNOSIS — G35 Multiple sclerosis: Secondary | ICD-10-CM | POA: Diagnosis not present

## 2020-03-10 DIAGNOSIS — G35D Multiple sclerosis, unspecified: Secondary | ICD-10-CM

## 2020-03-10 DIAGNOSIS — G40309 Generalized idiopathic epilepsy and epileptic syndromes, not intractable, without status epilepticus: Secondary | ICD-10-CM | POA: Diagnosis not present

## 2020-03-10 MED ORDER — ZONISAMIDE 100 MG PO CAPS: 100.0000 mg | ORAL_CAPSULE | Freq: Every day | ORAL | 3 refills | Status: DC

## 2020-03-10 NOTE — Telephone Encounter (Signed)
Placed JCV lab in [facility] lock box for routine lab pick up. Results pending. 

## 2020-03-10 NOTE — Progress Notes (Signed)
[facility]  PATIENT: Ruth Anderson DOB: [birth year]  REFERRING DOCTOR OR PCP:  none SOURCE: patient, husband and records from [facility] neurology and images from [facility].  _________________________________   HISTORICAL  CHIEF COMPLAINT:  Chief Complaint  Patient presents with  . Follow-up    RM 12, alone. Last seen 09/10/19.  . Multiple Sclerosis    On Tysabri. She missed infusion 03/02/20. Dr. Epimenio Foot approved for her to receive while here today for appt. Last JCV ab drawn 09/10/19 indeterminate, index: 0.21, inhibition assay: negative. NEEDS LABS TODAY  . Seizures    On Lamictal and Keppra  . Fatigue    Takes phentermine    HISTORY OF PRESENT ILLNESS:  Ruth Anderson is a 49 year old woman with relapsing remitting multiple sclerosis and epilepsy.     Update 03/10/2020: Her MS is stable and she has no exacerbarion or new symptom..  She is on Tysabri and tolerates it well.   She has been JCV negative.       Gait is off and she has some falls on uneven surfaces, especially outside.  She is off balanced off flat surfaces.    She holds the bannister on stairs.   She has bladder urgency and no longer takes oxybutynin.   Visual acuity either eye is fine with glasses but she has diplopia  She has no recent seizures.   She is on Keppra and lamotrigine.   She feels diplopia is worse on    She has some fatigue and decreased focus/attention/verbal fluency/processing.   Both the fstigue and cognitive issues improved with phentermine.   She denies depression.      Her husband had Covid-19 and was admitted due to pneumonia.   He is back home.   She was tested earlier this week and was negative.     Update 06/03/2019:  She feels stable in general with no exacerbations.   She is on Tysabri and feels better than she did when she was on Tecfidera,   She tolerates it well.   She notes more urinary urgency.   She has mild diplopia at times when she looks to the right  but not left.   She notes it most when she drives.  She is walking the same---mildly off balanced but no falls.   She has had a few falls.    She has not had any seizures since 2014.    She is on Keppra and Lamictal and tolerates them well.   She has a lot of twitching (mostly with her neck/head that bothers her but no change in level of consciousness)  This is single jerk at a time.      She notes fatigue in the afternoons.  Phentermine is helping some.     She is noting some UTI's  Update 09/04/2018: She is noting more issues with her MS, specifically, she feels weaker on the right.  She is on Tecfidera and has tolerated it well.  However, she feels that she did much better when she was on Tysabri than when she was on Tecfidera.  She notes some tasks are harder to do.    Her gait is ok but she tires out easily and hte right foot drags when more tired.   She stumbles and falls every month or two.  No injuries lately.    She notes blurry vision when she looks to the right.    She denies numbness or dysesthesia.    Bladder function is about  the same with some hesitancy at times.  She notes double vision when she looks to the right.      Sleep is poor with trouble falling asleep and staying asleep.   She averages 5 hours/night but some nights only 4.  Occasionally, she has a night with 9-10 hours to catch up' .  She has fatigue that is better with phentermine and modafinil.   She notes some difficulty with focus and attention.     No recent seizures, last one ([facility]) was 2014.  She needs brand-name drugs due to breakthrough seizures.   Update 03/11/2018: Her MS is stable.    She tolerates Tecfidera well and has no recent exacerbation.    She is walking the same and has stumbles and rare falls.  She tripped over a cord 4 days ago and landed on the front right side, landing on hand and knee on concrete.   She has had lower back pain, midline, without radiation.   Pain is worse with standing up, bending  over and with some activities and with coughing.    Pain is practically absent when sitting ir laying down.     She denies any new numbness or new weakness.    Bladder function is ok.   She has not tried any medication.      She denies any recent seizures.      Update 11/05/2017: She feels her MS has been stable.  She is on Tecfidera and she tolerates it well. She has not had any recent exacerbations.   MRI of the brain 11/04/2015 did not show any new lesions.    Her gait is mildly off balanced but she has no recent falls.  When tired, her left side is mildly weak  She has fatigue, mostly in the afternoon and early evenings     She usually goes to sleep around 8 am but sleeps poorly many nights and will often get out of bed for a while.   She sleeps in until 8 am.     Her mood is doing well.   She notes some cognitive issues with reduced focus and attention.  She also notes reduced memory and word finding abilities.  She denies any recent seizures. She continues on Keppra and lamotrigine.   From 05/07/2017:  MS:  She Was on Tecfidera but did not get her last order shipped to her. We discussed the importance of getting back on therapy. She does not think she had any exacerbations while she was on it and she tolerates it well.  In the past, I compare the MRI dated 11/04/2015 and to  the MRI from 11/25/2012. There was no definite change in the interim  Gait/strength/sensation:    Her gait is doing about the same. She stumbles but rarely has a fall. Generally her gait is worse when she is more tired or she is hot.  She notes left > right leg weakness with foot drop, worse when tired. She notes mild proximal left arm weakness. She has numbness in her hands.    No dysesthetic pain.  Bladder:   Bladder function is doing well. She has mild frequency and urgency but no incontinence. She had a urinary tract infection earlier this year..   She denies any issues with bowel.      She prefers not to take any  more med's.     Vision: She notes mild vision changes that have been mostly stable this year. When she looks to  the right she sometimes has double vision, especially when she is tired.   Fatigue/sleep: Fatigue is worse with heat and she gets tired quickly.   She has issues with both mental and physical fatigue.   Her fatigue did better on the Tysabri than on Tecfidera. Provigil and phentermine helps the fatigue some. She feels the quality of her sleep is good most nights but she sometimes has trouble quieting her mind.    Mood/cognition:   She notes mild depression.   Her sister passed away (MI February 13, 2013) and she still sometimes feels down.  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    She notes problems with attention, memory, processing speed and word finding.   Provigil helps some.   In the past, she was on Ritalin with benefit and better attention/focus.     Seizure:    Her last seizure was in 2014, shortly after her sister died and she needed intubation.   Her first seizure was in 1998 and they have been triggered by flashing Christmas tree lights. She has had about a dozen seizures overall with most of them occurring out of sleep but with a couple of them occurring during the daytime with severe ictal grogginess. For the most part, the seizures have been controlled on Lamictal 200 mg 3 times a day and Keppra 1500 mg twice a day.    Of note, she had breakthrough seizure on generic so we have been writing for the brand.      MS History:  She was diagnosed with MS in 1988 placed on Betaseron when it became available.  Later on she switched to Copaxone and Avonex. When Tysabri became available in 2006, she switched and had 70 infusions over the next 6  years (monthly except for a 6 month holiday).   She tolerated Tysabri well, was stable and was JCV antibody negative. However, she did not like having to do the monthly to hour infusions. In May 2013, she started Tecfidera. She  had flushing that persisted after many months but did not have significant GI issues. I last saw her on 07/29/2014 and she reported that her MS remains stable on the Tecfidera. I personally reviewed her MRI of the brain from 2015. It was compared to an MRI dated 12/12/2009. She has signal matter signal changes in the hemispheres consistent with multiple sclerosis. Many of these are radially oriented to the lateral ventricles in the corpus callosum and others are white matter. Brain stem appears normal. There were no acute findings and there was no significant changes when compared to the MRI dated 12/12/2009. Vitamin D was 32.9 in July 2015. Lymphocyte count was 0.8 on 07/29/2014.   REVIEW OF SYSTEMS: Constitutional: No fevers, chills, sweats, or change in appetite.  Notes some fatigue Eyes: as above.  No eye pain Ear, nose and throat: No hearing loss, ear pain, nasal congestion, sore throat Cardiovascular: No chest pain, palpitations Respiratory: No shortness of breath at rest or with exertion.   No wheezes GastrointestinaI: No nausea, vomiting, diarrhea, abdominal pain, fecal incontinence Genitourinary: No dysuria, urinary retention or frequency.  No nocturia. Musculoskeletal: No neck pain, back pain Integumentary: No rash, pruritus, skin lesions Neurological: as above Psychiatric: No depression at this time.  No anxiety Endocrine: No palpitations, diaphoresis, change in appetite, change in weigh or increased thirst Hematologic/Lymphatic: No anemia, purpura, petechiae. Allergic/Immunologic: No itchy/runny eyes, nasal congestion, recent allergic reactions, rashes  ALLERGIES: No Known Allergies  HOME MEDICATIONS:  Current Outpatient Medications:  .  IBU 800 MG tablet, TAKE 1 TABLET BY MOUTH 3 TIMES DAILY WITH FOOD AS NEEDED FOR PAIN & INFLAMMATION, Disp: 270 tablet, Rfl: 0 .  KEPPRA 750 MG tablet, Take 2 tablets (1,500 mg total) by mouth 2 (two) times daily., Disp: 360 tablet, Rfl: 3 .   Lacosamide (VIMPAT) 150 MG TABS, One po bid, Disp: 60 tablet, Rfl: 5 .  LAMICTAL 200 MG tablet, TAKE 1 TABLET BY MOUTH 3 (THREE) TIMES DAILY. BRAND NAME LAMICTAL FOR SEIZURE DISORDER, Disp: 270 tablet, Rfl: 3 .  methocarbamol (ROBAXIN) 750 MG tablet, TAKE 1 TABLET BY MOUTH 4 TIMES DAILY, Disp: 120 tablet, Rfl: 0 .  modafinil (PROVIGIL) 200 MG tablet, Take 1 tablet (200 mg total) by mouth 2 (two) times daily., Disp: 60 tablet, Rfl: 5 .  natalizumab (TYSABRI) 300 MG/15ML injection, Inject into the vein., Disp: , Rfl:  .  oxybutynin (DITROPAN) 5 MG tablet, Take 1 tablet (5 mg total) by mouth 2 (two) times daily., Disp: 180 tablet, Rfl: 3 .  VITAMIN D PO, Take 5,000 Units by mouth daily., Disp: , Rfl:  .  zonisamide (ZONEGRAN) 100 MG capsule, Take 1 capsule (100 mg total) by mouth daily., Disp: 180 capsule, Rfl: 3 No current facility-administered medications for this visit.  Facility-Administered Medications Ordered in Other Visits:  .  gadopentetate dimeglumine (MAGNEVIST) injection 17 mL, 17 mL, Intravenous, Once PRN, Yosselyn Tax, Pearletha Furl, MD  PAST MEDICAL HISTORY: Past Medical History:  Diagnosis Date  . Multiple sclerosis (HCC)   . Seizures (HCC)   . Vision abnormalities     PAST SURGICAL HISTORY: Past Surgical History:  Procedure Laterality Date  . IR IMAGING GUIDED PORT INSERTION  09/14/2019  . IR REMOVAL TUN ACCESS W/ PORT W/O FL MOD SED  09/14/2019    FAMILY HISTORY: Family History  Problem Relation Age of Onset  . Hypertension Mother   . Heart disease Father   . Hypertension Father   . Stroke Father   . Diabetes type II Father     SOCIAL HISTORY:  Social History   Socioeconomic History  . Marital status: Married    Spouse name: Not on file  . Number of children: Not on file  . Years of education: Not on file  . Highest education level: Not on file  Occupational History  . Not on file  Tobacco Use  . Smoking status: Former Games developer  . Smokeless tobacco: Never Used   Substance and Sexual Activity  . Alcohol use: No    Alcohol/week: 0.0 standard drinks  . Drug use: No  . Sexual activity: Not on file  Other Topics Concern  . Not on file  Social History Narrative  . Not on file   Social Determinants of Health   Financial Resource Strain:   . Difficulty of Paying Living Expenses:   Food Insecurity:   . Worried About Programme researcher, broadcasting/film/video in the Last Year:   . Barista in the Last Year:   Transportation Needs:   . Freight forwarder (Medical):   Marland Kitchen Lack of Transportation (Non-Medical):   Physical Activity:   . Days of Exercise per Week:   . Minutes of Exercise per Session:   Stress:   . Feeling of Stress :   Social Connections:   . Frequency of Communication with Friends and Family:   . Frequency of Social Gatherings with Friends and Family:   . Attends Religious Services:   .  Active Member of Clubs or Organizations:   . Attends Banker Meetings:   Marland Kitchen Marital Status:   Intimate Partner Violence:   . Fear of Current or Ex-Partner:   . Emotionally Abused:   Marland Kitchen Physically Abused:   . Sexually Abused:      PHYSICAL EXAM  Vitals:   03/10/20 1256  BP: 120/74  Pulse: 73  Height: 5\' 8"  (1.727 m)    Body mass index is 26.61 kg/m.   General: The patient is well-developed and well-nourished and in no acute distress  Musculoskeletal: She is tender over L4-L5 midline   Neurologic Exam  Mental status: The patient is alert and oriented x 3 at the time of the examination. The patient has apparent normal recent and remote memory, with an apparently normal attention span and concentration ability.   Speech is normal.  Cranial nerves: She noted mild diplopia but I could not see a dysconjugate gaze looking up to the right.  There is no nystagmus.  Visual acuity is symmetric. Facial strength and sensation are normal. Trapezius strength is normal..   No obvious hearing deficits are noted.  Motor:  Muscle bulk is normal.   She has mildly increased muscle tone in the legs, left greater than right.  . Strength is  5 / 5 in the arms but has slight left foot weakness.  Sensory:   She has normal sensation to touch and vibration.  Coordination: Cerebellar testing reveals good finger-nose-finger and heel-to-shin bilaterally.  Gait and station: Station is normal.   Her gait is mildly spastic, left greater than right.  The gait is mildly wide and there is a mild left foot drop.  Tandem gait is poor.  Romberg is negative.   Reflexes: Deep tendon reflexes are symmetric and brisk bilaterally.        DIAGNOSTIC DATA (LABS, IMAGING, TESTING) - I reviewed patient records, labs, notes, testing and imaging myself where available.     ASSESSMENT AND PLAN  Multiple sclerosis (HCC) - Plan: Stratify JCV Antibody Test ([facility]), CBC with Differential/Platelet  Epilepsy, generalized, convulsive (HCC)  High risk medication use - Plan: Stratify JCV Antibody Test ([facility]), CBC with Differential/Platelet  Left foot drop  Diplopia    1. Continue Tysabri.    Check JCV and CBC.    2.  Continue Keppra   (brand necessary as she had a severe breakthrough seizure while on generic requiring intubation).   She had side effects on lamotrigine and Vimpat.  I will add zonisamide. 3.  Continue phentermine.  She stopped modafinil 4.   Continue prn oxybutynin 5    She will return to see me in 6 months will sooner if she has new or worsening neurologic symptoms.   Lavaris Sexson A. Epimenio Foot, MD, PhD 03/11/2020, 4:17 PM Certified in Neurology, Clinical Neurophysiology, Sleep Medicine, Pain Medicine and Neuroimaging  [facility] [address], [address] [place], [place] [address] [phone]

## 2020-03-10 NOTE — Patient Instructions (Signed)
For one week take zonisamide 100 mg (ONE pill) on top of regular Keppra and Lamictal three times a day For next week take zonisamide TWO pills, regular Keppra dose and Lamictal twice a day For next week take zonisamide TWO pills, regular Keppra dose and 1/2 Lamictal twice a day Then take zonisamide TWO pills, regular Keppra dose and stop Lamictal

## 2020-03-11 ENCOUNTER — Encounter: Payer: Self-pay | Admitting: Neurology

## 2020-03-11 LAB — CBC WITH DIFFERENTIAL/PLATELET
Basophils Absolute: 0.1 10*3/uL (ref 0.0–0.2)
Basos: 2 %
EOS (ABSOLUTE): 0.3 10*3/uL (ref 0.0–0.4)
Eos: 5 %
Hematocrit: 32.8 % — ABNORMAL LOW (ref 34.0–46.6)
Hemoglobin: 11.7 g/dL (ref 11.1–15.9)
Immature Grans (Abs): 0 10*3/uL (ref 0.0–0.1)
Immature Granulocytes: 0 %
Lymphocytes Absolute: 1.9 10*3/uL (ref 0.7–3.1)
Lymphs: 39 %
MCH: 32.1 pg (ref 26.6–33.0)
MCHC: 35.7 g/dL (ref 31.5–35.7)
MCV: 90 fL (ref 79–97)
Monocytes Absolute: 0.5 10*3/uL (ref 0.1–0.9)
Monocytes: 10 %
NRBC: 1 % — ABNORMAL HIGH (ref 0–0)
Neutrophils Absolute: 2.1 10*3/uL (ref 1.4–7.0)
Neutrophils: 44 %
Platelets: 260 10*3/uL (ref 150–450)
RBC: 3.64 x10E6/uL — ABNORMAL LOW (ref 3.77–5.28)
RDW: 13 % (ref 11.7–15.4)
WBC: 4.8 10*3/uL (ref 3.4–10.8)

## 2020-03-16 DIAGNOSIS — Z0271 Encounter for disability determination: Secondary | ICD-10-CM

## 2020-03-19 ENCOUNTER — Other Ambulatory Visit: Payer: Self-pay | Admitting: Neurology

## 2020-03-21 NOTE — Telephone Encounter (Signed)
JCV ab drawn on 03/10/20 indeterminate, index: 0.22. Inhibition assay: negative.

## 2020-03-23 ENCOUNTER — Telehealth: Payer: Self-pay | Admitting: Neurology

## 2020-03-23 MED ORDER — LAMICTAL 200 MG PO TABS: ORAL_TABLET | ORAL | 3 refills | Status: DC

## 2020-03-23 NOTE — Telephone Encounter (Addendum)
Called pt back to get further information. She states zonisamide made her loopy. She took for a few days then stopped.  She wants to go back to taking Lamictal 200mg . She took 1 tablet this am. Wanting to know if Dr. will approve rx for Lamictal 200mg  (2 tabs po qhs). Advised I will forward message to MD. Once he replies, I will call back.

## 2020-03-23 NOTE — Telephone Encounter (Signed)
Okay to get back on lamotrigine 200 mg twice a day.  It would be better to take spread out in a.m. and p.m. rather than both at night.

## 2020-03-23 NOTE — Addendum Note (Signed)
Addended by: Arther Abbott on: 03/23/2020 01:53 PM   Modules accepted: Orders

## 2020-03-23 NOTE — Telephone Encounter (Signed)
Called pt. Relayed Dr. Bonnita Hollow message. She verbalized understanding. I e-scribed new rx to [facility] for her.

## 2020-03-23 NOTE — Addendum Note (Signed)
Addended by: Arther Abbott on: 03/23/2020 11:41 AM   Modules accepted: Orders

## 2020-03-23 NOTE — Telephone Encounter (Signed)
Pt is asking for a call from RN to discuss her feeling goofy on the new seizure medication please call

## 2020-03-29 ENCOUNTER — Other Ambulatory Visit: Payer: Self-pay | Admitting: Neurology

## 2020-04-04 ENCOUNTER — Other Ambulatory Visit: Payer: Self-pay | Admitting: Neurology

## 2020-04-27 ENCOUNTER — Telehealth: Payer: Self-pay | Admitting: Neurology

## 2020-04-27 NOTE — Telephone Encounter (Signed)
Faxed records to [facility] 04/27/2020.

## 2020-05-03 ENCOUNTER — Telehealth: Payer: Self-pay | Admitting: *Deleted

## 2020-05-03 NOTE — Telephone Encounter (Signed)
Faxed completed/signed Tysabri pt status report and reauth questionnaire to [facility] at [phone]. Received confirmation.   Received fax from touch prescribing program that pt re-authorized for Tysabri from 05/03/20-12/02/20. Pt enrollment number: [id number]. Account: [employer]. Site auth number: [id number].

## 2020-05-04 ENCOUNTER — Other Ambulatory Visit: Payer: Self-pay | Admitting: Neurology

## 2020-08-22 ENCOUNTER — Telehealth: Payer: Self-pay | Admitting: *Deleted

## 2020-08-22 ENCOUNTER — Other Ambulatory Visit: Payer: Self-pay | Admitting: Neurology

## 2020-08-22 NOTE — Telephone Encounter (Signed)
Submitted urgent PA lamictal on CMM. Key: [id number].Received instant approval: CaseId:[id number];Status:Approved;Review Type:Prior Auth;Coverage Start Date:07/23/2020;Coverage End Date:08/22/2021;

## 2020-09-10 IMAGING — US IR IMAGING GUIDED PORT INSERTION
1 series · 1 of 1 positions shown · non-contrast
Comparison: None.

INDICATION: 48-year-old with multiple sclerosis with poor venous access. Patient
requires IV infusions. Patient has a right subclavian Port-A-Cath
that is at least 10 years old and has not been used for many years.
Reportedly, the port is no longer working. Patient presents for
placement of [REDACTED]-A-Cath.

EXAM:
FLUOROSCOPIC AND ULTRASOUND GUIDED PLACEMENT OF A SUBCUTANEOUS PORT
REMOVAL OF SUBCUTANEOUS PORT

[Series 1: ir imaging guided port insertion · 1 of 1 slices shown]
[im 1/1]
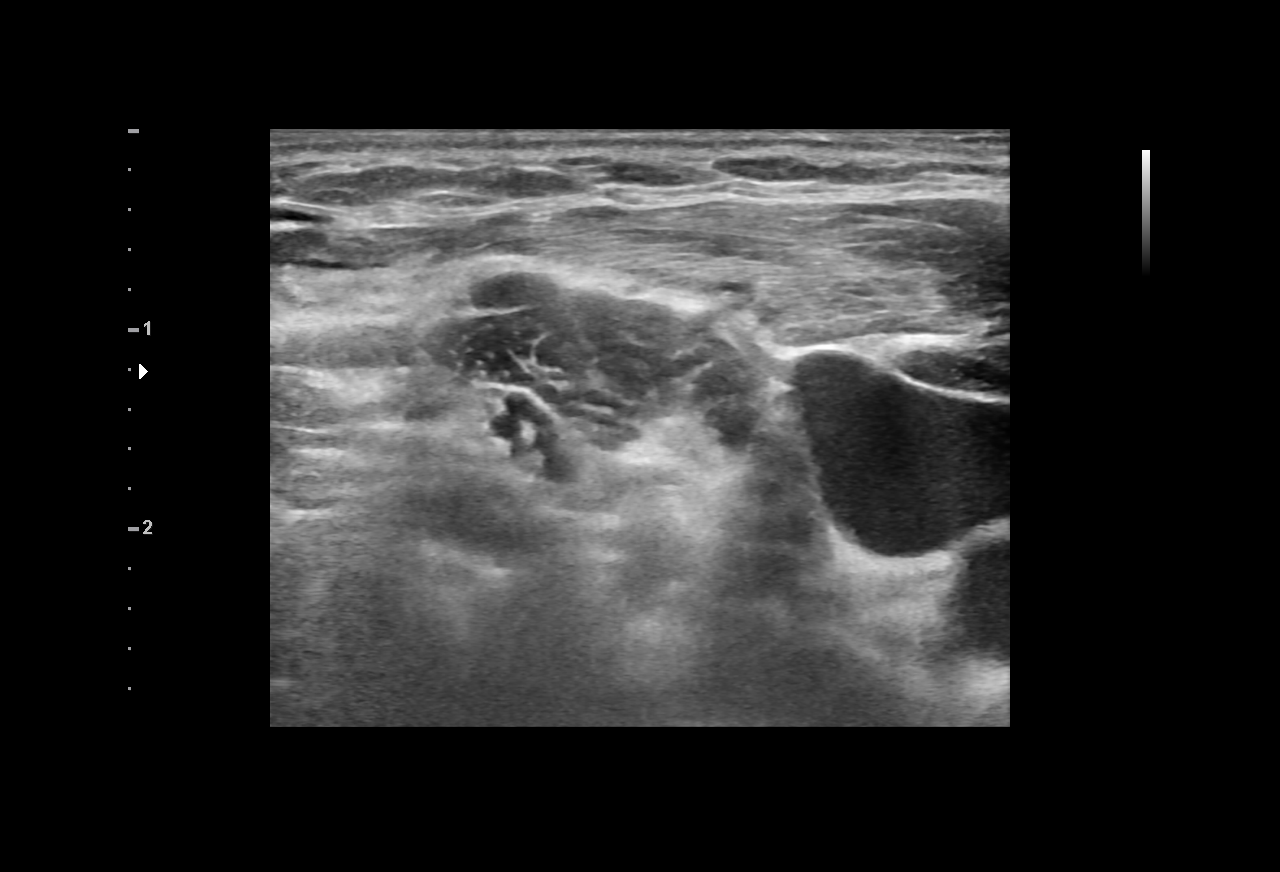

[1 of 1 positions shown; findings below may reference images not displayed]

MEDICATIONS:
Ancef 2 g; The antibiotic was administered within an appropriate
time interval prior to skin puncture.

ANESTHESIA/SEDATION:
Versed 2.0 mg IV; Fentanyl 100 mcg IV;

Moderate Sedation Time:  36 minutes

The patient was continuously monitored during the procedure by the
interventional radiology nurse under my direct supervision.

FLUOROSCOPY TIME:  24 seconds, 1 mGy

COMPLICATIONS:
None immediate.

PROCEDURE:
The procedure, risks, benefits, and alternatives were explained to
the patient. Questions regarding the procedure were encouraged and
answered. The patient understands and consents to the procedure.

Patient was placed supine on the interventional table. Ultrasound
confirmed a patent right internal jugular vein. Ultrasound image was
saved for documentation. The right chest and neck were cleaned with
a skin antiseptic and a sterile drape was placed. Maximal barrier
sterile technique was utilized including caps, mask, sterile gowns,
sterile gloves, sterile drape, hand hygiene and skin antiseptic. The
skin was anesthetized below the old right chest port incision with
1% lidocaine. An incision was made just below the old scar. The old
port was identified and the pocket appeared to be clean. The right
chest port was removed without complication. Pocket was irrigated
with sterile saline. The right neck was anesthetized with 1%
lidocaine. Small incision was made in the right neck with a blade.
Micropuncture set was placed in the right internal jugular vein with
ultrasound guidance. The micropuncture wire was used for measurement
purposes. 8 french Power Port was assembled. Subcutaneous tunnel was
formed with a stiff tunneling device. The port catheter was brought
through the subcutaneous tunnel. The port was placed in the
subcutaneous pocket and sutured in place with 2-0 Ethilon sutures.
The micropuncture set was exchanged for a peel-away sheath. The
catheter was placed through the peel-away sheath and the tip was
positioned at the SVC and right atrium junction. Catheter placement
was confirmed with fluoroscopy. The port was accessed and flushed
with heparinized saline. The port pocket was closed using two layers
of absorbable sutures and Dermabond. The vein skin site was closed
using a single layer of absorbable suture and Dermabond. Sterile
dressings were applied. Patient tolerated the procedure well without
an immediate complication. Ultrasound and fluoroscopic images were
taken and saved for this procedure.
IMPRESSION: Removal of the old right subclavian Port-A-Cath. Placement of a new
right jugular Port-A-Cath. Catheter tip at the SVC and right atrium
junction and ready to be used.

## 2020-09-14 ENCOUNTER — Ambulatory Visit: Payer: BC Managed Care – PPO | Admitting: Neurology

## 2020-09-14 ENCOUNTER — Telehealth: Payer: Self-pay | Admitting: *Deleted

## 2020-09-14 ENCOUNTER — Encounter: Payer: Self-pay | Admitting: Neurology

## 2020-09-14 VITALS — BP 134/79 | HR 73 | Ht 68.0 in

## 2020-09-14 DIAGNOSIS — G35 Multiple sclerosis: Secondary | ICD-10-CM

## 2020-09-14 DIAGNOSIS — Z79899 Other long term (current) drug therapy: Secondary | ICD-10-CM | POA: Diagnosis not present

## 2020-09-14 DIAGNOSIS — M21372 Foot drop, left foot: Secondary | ICD-10-CM

## 2020-09-14 DIAGNOSIS — H532 Diplopia: Secondary | ICD-10-CM

## 2020-09-14 DIAGNOSIS — G40309 Generalized idiopathic epilepsy and epileptic syndromes, not intractable, without status epilepticus: Secondary | ICD-10-CM

## 2020-09-14 MED ORDER — MODAFINIL 200 MG PO TABS
200.0000 mg | ORAL_TABLET | Freq: Two times a day (BID) | ORAL | 5 refills | Status: DC
Start: 2020-09-14 — End: 2021-03-16

## 2020-09-14 NOTE — Progress Notes (Signed)
[facility]  PATIENT: Ruth Anderson DOB: Mar 19, 1971  REFERRING DOCTOR OR PCP:  none SOURCE: patient, husband and records from [facility] neurology and images from [facility].  _________________________________   HISTORICAL  CHIEF COMPLAINT:  Chief Complaint  Patient presents with  . Follow-up    RM 12, alone. Last seen 03/10/20. On Tysabri for MS. Last infusion 08/25/20, next: 09/26/20. Receives at [facility] w/ intrafusion. Last JCV 03/10/20 indeterminate, index: 0.22, inhibition assay: negative.  Seizures: takes Keppra, Lamictal, Vimpat  . Medication Refill    [facility]S [facility]. No longer wants to use [facility]    HISTORY OF PRESENT ILLNESS:  [name] is a 50 year old woman with relapsing remitting multiple sclerosis and epilepsy.     Update1/19/2022 Her MS is stable and she has no exacerbarion or new symptom..  She is on Tysabri and tolerates it well.   She has been JCV  negative.     Gait is off and she has some falls on uneven surfaces, especially outside.  She is off balanced off flat surfaces.    She holds the bannister on stairs.   She has bladder urgency and now rarely takes oxybutynin.   Visual acuity either eye is fine with glasses but she has diplopia  She has no recent seizures.   She is on Keppra and Lamictal   She feels diplopia was worse with lamotrigine and we tried Keppra plus Vimpat and Keppra plus zonisamide but she had more trouble tolerating those combinations.  She has fatigue most days and decreased focus/attention/verbal fluency/processing.   Both the fstigue and cognitive issues improved with phentermine and more recently on modafinil with benefit . She sleeps well most nights.    She notes mild depression - she has more stress with husband having recent orthopedic surgery and requiring her help.       Her husband had Covid-19 and was admitted due to pneumonia.   He is back home.   She was tested twice and was  negative.  She has not don the vaccination.   She is reluctant to do so as she had issues with other vaccinations.      MS History:  She was diagnosed with MS in 1988 placed on Betaseron when it became available.  Later on she switched to Copaxone and Avonex. When Tysabri became available in 2006, she switched and had 70 infusions over the next 6  years (monthly except for a 6 month holiday).   She tolerated Tysabri well, was stable and was JCV antibody negative. However, she did not like having to do the monthly to hour infusions. In May 2013, she started Tecfidera. She had flushing that persisted after many months but did not have significant GI issues.  She went back to [place] in 2016.   Vitamin D was 32.9 in July 2015. Lymphocyte count was 0.8 on 07/29/2014.  Seizure History:   Her first seizure was in 1998 and they have been triggered by flashing Christmas tree lights. She has had about a dozen seizures overall with most of them occurring out of sleep but with a couple of them occurring during the daytime with severe ictal grogginess. For the most part, the seizures have been controlled on Lamictal 200 mg 3 times a day and Keppra 1500 mg twice a day.    Of note, she had breakthrough seizure on generic so we have been writing for the brand.     Her last seizure was in  2014, shortly after her sister died.  She needed intubation.    Imaging: MRI of the brain 12/03/2019 showed no new lesions compared to the MRI from 09/23/2018:    MRI of the brain 09/23/2018 showed no new lesions compared to the MRI 11/02/2015.    MRI of the brain 11/02/2015 shows T2/FLAIR hyperintense foci in the cerebellum, brainstem and cerebral hemispheres in a pattern and configuration consistent with chronic demyelinating plaque associated with multiple sclerosis.    There is a normal enhancement pattern.. None of the foci appeared to be acute.    The extent of demyelinating foci appears essentially unchanged since  11/25/2012.    REVIEW OF SYSTEMS: Constitutional: No fevers, chills, sweats, or change in appetite.  Notes some fatigue Eyes: as above.  No eye pain Ear, nose and throat: No hearing loss, ear pain, nasal congestion, sore throat Cardiovascular: No chest pain, palpitations Respiratory: No shortness of breath at rest or with exertion.   No wheezes GastrointestinaI: No nausea, vomiting, diarrhea, abdominal pain, fecal incontinence Genitourinary: No dysuria, urinary retention or frequency.  No nocturia. Musculoskeletal: No neck pain, back pain Integumentary: No rash, pruritus, skin lesions Neurological: as above Psychiatric: No depression at this time.  No anxiety Endocrine: No palpitations, diaphoresis, change in appetite, change in weigh or increased thirst Hematologic/Lymphatic: No anemia, purpura, petechiae. Allergic/Immunologic: No itchy/runny eyes, nasal congestion, recent allergic reactions, rashes  ALLERGIES: Allergies  Allergen Reactions  . Zonisamide     "made me loopy"    HOME MEDICATIONS:  Current Outpatient Medications:  .  IBU 800 MG tablet, TAKE 1 TABLET BY MOUTH 3 TIMES DAILY WITH FOOD AS NEEDED FOR PAIN & INFLAMMATION, Disp: 270 tablet, Rfl: 0 .  KEPPRA 750 MG tablet, TAKE 2 TABLETS TWICE A DAY, Disp: 360 tablet, Rfl: 3 .  LAMICTAL 200 MG tablet, TAKE 1 TABLET BY MOUTH TWO TIMES DAILY. BRAND NAME LAMICTAL FOR SEIZURE DISORDER, Disp: 180 tablet, Rfl: 3 .  methocarbamol (ROBAXIN) 750 MG tablet, TAKE 1 TABLET BY MOUTH 4 TIMES DAILY, Disp: 120 tablet, Rfl: 5 .  natalizumab (TYSABRI) 300 MG/15ML injection, Inject into the vein., Disp: , Rfl:  .  oxybutynin (DITROPAN) 5 MG tablet, Take 1 tablet (5 mg total) by mouth 2 (two) times daily., Disp: 180 tablet, Rfl: 3 .  VITAMIN D PO, Take 5,000 Units by mouth daily., Disp: , Rfl:  .  modafinil (PROVIGIL) 200 MG tablet, Take 1 tablet (200 mg total) by mouth 2 (two) times daily., Disp: 60 tablet, Rfl: 5 No current  facility-administered medications for this visit.  Facility-Administered Medications Ordered in Other Visits:  .  gadopentetate dimeglumine (MAGNEVIST) injection 17 mL, 17 mL, Intravenous, Once PRN, Alfredo Spong, Pearletha Furlichard A, MD  PAST MEDICAL HISTORY: Past Medical History:  Diagnosis Date  . Multiple sclerosis (HCC)   . Seizures (HCC)   . Vision abnormalities     PAST SURGICAL HISTORY: Past Surgical History:  Procedure Laterality Date  . IR IMAGING GUIDED PORT INSERTION  09/14/2019  . IR REMOVAL TUN ACCESS W/ PORT W/O FL MOD SED  09/14/2019    FAMILY HISTORY: Family History  Problem Relation Age of Onset  . Hypertension Mother   . Heart disease Father   . Hypertension Father   . Stroke Father   . Diabetes type II Father     SOCIAL HISTORY:  Social History   Socioeconomic History  . Marital status: Married    Spouse name: Not on file  . Number of children: Not on  file  . Years of education: Not on file  . Highest education level: Not on file  Occupational History  . Not on file  Tobacco Use  . Smoking status: Former Games developer  . Smokeless tobacco: Never Used  Substance and Sexual Activity  . Alcohol use: No    Alcohol/week: 0.0 standard drinks  . Drug use: No  . Sexual activity: Not on file  Other Topics Concern  . Not on file  Social History Narrative  . Not on file   Social Determinants of Health   Financial Resource Strain: Not on file  Food Insecurity: Not on file  Transportation Needs: Not on file  Physical Activity: Not on file  Stress: Not on file  Social Connections: Not on file  Intimate Partner Violence: Not on file     PHYSICAL EXAM  Vitals:   09/14/20 1410  BP: 134/79  Pulse: 73  Height: 5\' 8"  (1.727 m)    Body mass index is 26.61 kg/m.   General: The patient is well-developed and well-nourished and in no acute distress  Musculoskeletal: She is tender over L4-L5 midline   Neurologic Exam  Mental status: The patient is alert and  oriented x 3 at the time of the examination. The patient has apparent normal recent and remote memory, with an apparently normal attention span and concentration ability.   Speech is normal.  Cranial nerves: She noted mild diplopia but I could not see a dysconjugate gaze looking up to the right.  There is no nystagmus.  Visual acuity is symmetric. Facial strength and sensation are normal. Trapezius strength is normal..   No obvious hearing deficits are noted.  Motor:  Muscle bulk is normal.  She has mildly increased muscle tone in the legs, left greater than right.  . Strength is  5 / 5 in the arms but 4+/5 in the left toes and ankles  Sensory:   She has normal sensation to touch and vibration.  Coordination: Cerebellar testing reveals good finger-nose-finger and heel-to-shin bilaterally.  Gait and station: Station is normal.   The gait is mildly spastic, left greater than right.  There is a mild left foot drop and the gait is mildly wide.  Tandem gait is moderately wide.  Romberg is negative.   Reflexes: Deep tendon reflexes are symmetric and brisk bilaterally.         ASSESSMENT AND PLAN  Multiple sclerosis (HCC) - Plan: Stratify JCV Antibody Test ([facility]), CBC with Differential/Platelet, CANCELED: Stratify JCV Antibody Test ([facility]), CANCELED: CBC with Differential/Platelet  Epilepsy, generalized, convulsive (HCC)  High risk medication use - Plan: Stratify JCV Antibody Test ([facility]), CBC with Differential/Platelet, CANCELED: Stratify JCV Antibody Test ([facility]), CANCELED: CBC with Differential/Platelet  Left foot drop  Diplopia    1. Continue Tysabri.    Check JCV and CBC when she comes in for her next infusion.    2.  Continue Keppra   (brand necessary as she had a severe breakthrough seizure while on generic requiring intubation).   Continue Lamictal.   3.  Continue modafinil for fatigue.  She may have done slightly better with phentermine and that could be reconsidered in the  future. 4.   Continue prn oxybutynin 5    She will return to see me in 6 months will sooner if she has new or worsening neurologic symptoms.   Julius Matus A. , MD, PhD 09/14/2020, 3:07 PM Certified in Neurology, Clinical Neurophysiology, Sleep Medicine, Pain Medicine and Neuroimaging  [facility] [phone]  [address], [address] [place], [place] [address] [phone]

## 2020-09-14 NOTE — Telephone Encounter (Signed)
Spoke with MD. Pt did not get labs completed today. She plans to complete at her next infusion. I informed infusion suite ([facility])

## 2020-09-26 ENCOUNTER — Telehealth: Payer: Self-pay | Admitting: *Deleted

## 2020-09-26 NOTE — Addendum Note (Signed)
Addended by: Tamera Stands D on: 09/26/2020 10:25 AM   Modules accepted: Orders

## 2020-09-26 NOTE — Telephone Encounter (Signed)
Placed JCV lab in [facility] lock box for routine lab pick up. Results pending. 

## 2020-09-27 ENCOUNTER — Telehealth: Payer: Self-pay | Admitting: Neurology

## 2020-09-27 LAB — CBC WITH DIFFERENTIAL/PLATELET
Basophils Absolute: 0.1 10*3/uL (ref 0.0–0.2)
Basos: 2 %
EOS (ABSOLUTE): 0.4 10*3/uL (ref 0.0–0.4)
Eos: 7 %
Hematocrit: 36.7 % (ref 34.0–46.6)
Hemoglobin: 12.1 g/dL (ref 11.1–15.9)
Immature Grans (Abs): 0 10*3/uL (ref 0.0–0.1)
Immature Granulocytes: 0 %
Lymphocytes Absolute: 2 10*3/uL (ref 0.7–3.1)
Lymphs: 37 %
MCH: 29.9 pg (ref 26.6–33.0)
MCHC: 33 g/dL (ref 31.5–35.7)
MCV: 91 fL (ref 79–97)
Monocytes Absolute: 0.5 10*3/uL (ref 0.1–0.9)
Monocytes: 8 %
NRBC: 1 % — ABNORMAL HIGH (ref 0–0)
Neutrophils Absolute: 2.6 10*3/uL (ref 1.4–7.0)
Neutrophils: 46 %
Platelets: 296 10*3/uL (ref 150–450)
RBC: 4.05 x10E6/uL (ref 3.77–5.28)
RDW: 13 % (ref 11.7–15.4)
WBC: 5.6 10*3/uL (ref 3.4–10.8)

## 2020-09-27 MED ORDER — METHOCARBAMOL 750 MG PO TABS: ORAL_TABLET | ORAL | 5 refills | Status: DC

## 2020-09-27 MED ORDER — IBUPROFEN 800 MG PO TABS: ORAL_TABLET | ORAL | 0 refills | Status: DC

## 2020-09-27 NOTE — Telephone Encounter (Signed)
Pt. is requesting for  IBU 800 MG tablet methocarbamol (ROBAXIN) 750 MG tablet & modafinil (PROVIGIL) 200 MG tablet [id number].

## 2020-09-27 NOTE — Telephone Encounter (Signed)
Called and spoke with [facility]. cx any remaining refills on file.  Dr. Epimenio Foot already sent in [facility] with 5 refills. E- [facility]am[facility] and Ibuprofen.

## 2020-09-27 NOTE — Addendum Note (Signed)
Addended by: Arther Abbott on: 09/27/2020 12:15 PM   Modules accepted: Orders

## 2020-10-03 ENCOUNTER — Other Ambulatory Visit: Payer: Self-pay | Admitting: Neurology

## 2020-10-03 ENCOUNTER — Telehealth: Payer: Self-pay | Admitting: *Deleted

## 2020-10-03 NOTE — Telephone Encounter (Signed)
Submitted urgent PA request for PA Keppra (brand name) on CMM. Key: [id number]. Received instant approval: [id number];SFKCLE:[id number];Review Type:Prior Auth;Coverage Start Date:09/03/2020;Coverage End Date:10/03/2021;

## 2020-10-03 NOTE — Telephone Encounter (Signed)
JCV ab drawn on 09/26/20 indeterminate, index: 0.20. Inhibition assay: negative

## 2020-10-31 ENCOUNTER — Telehealth: Payer: Self-pay | Admitting: *Deleted

## 2020-10-31 NOTE — Telephone Encounter (Signed)
Faxed completed/signed Tysabri pt status report and reauth questionnaire to [place] [facility] at [phone]. Received confirmation.   Received fax from touch prescribing program that pt re-authorized for Tysabri from 10/31/2020-06/03/2021. Pt enrollment number: [id number]. Account: [employer]. Site auth number: [id number].

## 2020-11-01 ENCOUNTER — Telehealth: Payer: Self-pay | Admitting: Neurology

## 2020-11-01 ENCOUNTER — Telehealth: Payer: Self-pay | Admitting: *Deleted

## 2020-11-01 NOTE — Telephone Encounter (Signed)
Submitted PA modafinil on CMM. Key: [id number]. Received instant approval: CaseId:[id number];Status:Approved;Review Type:Prior Auth;Coverage Start Date:10/02/2020;Coverage End Date:11/01/2021;

## 2020-11-01 NOTE — Telephone Encounter (Signed)
Dr. Epimenio Foot- FYI Called pt. Confirmed per last OV note she has L foot drop, wears brace on this foot. Feels right leg is weak. This is not a new sx. Has not worsened since last visit. Advised her to continue to monitor. If she develops new or worsening sx, she should call and let us know. She verbalized understanding.

## 2020-11-01 NOTE — Telephone Encounter (Signed)
Pt called, got a brace for my left leg and my right leg is weak. Would like a call from the nurse.

## 2020-11-16 ENCOUNTER — Telehealth: Payer: Self-pay

## 2020-11-23 NOTE — Telephone Encounter (Signed)
error 

## 2020-12-30 ENCOUNTER — Other Ambulatory Visit: Payer: Self-pay | Admitting: Neurology

## 2021-03-13 ENCOUNTER — Other Ambulatory Visit: Payer: Self-pay | Admitting: Neurology

## 2021-03-13 ENCOUNTER — Other Ambulatory Visit: Payer: Self-pay

## 2021-03-13 ENCOUNTER — Other Ambulatory Visit (INDEPENDENT_AMBULATORY_CARE_PROVIDER_SITE_OTHER): Payer: BC Managed Care – PPO

## 2021-03-13 ENCOUNTER — Telehealth: Payer: Self-pay | Admitting: Neurology

## 2021-03-13 DIAGNOSIS — G35 Multiple sclerosis: Secondary | ICD-10-CM

## 2021-03-13 DIAGNOSIS — Z79899 Other long term (current) drug therapy: Secondary | ICD-10-CM

## 2021-03-13 DIAGNOSIS — Z0289 Encounter for other administrative examinations: Secondary | ICD-10-CM

## 2021-03-13 NOTE — Telephone Encounter (Signed)
Placed JCV lab in [facility] lock box for routine lab pick up. Results pending. 

## 2021-03-14 LAB — CBC WITH DIFFERENTIAL/PLATELET
Basophils Absolute: 0.1 10*3/uL (ref 0.0–0.2)
Basos: 1 %
EOS (ABSOLUTE): 0.3 10*3/uL (ref 0.0–0.4)
Eos: 5 %
Hematocrit: 34.7 % (ref 34.0–46.6)
Hemoglobin: 11.4 g/dL (ref 11.1–15.9)
Immature Grans (Abs): 0 10*3/uL (ref 0.0–0.1)
Immature Granulocytes: 0 %
Lymphocytes Absolute: 2.1 10*3/uL (ref 0.7–3.1)
Lymphs: 40 %
MCH: 29.6 pg (ref 26.6–33.0)
MCHC: 32.9 g/dL (ref 31.5–35.7)
MCV: 90 fL (ref 79–97)
Monocytes Absolute: 0.4 10*3/uL (ref 0.1–0.9)
Monocytes: 8 %
Neutrophils Absolute: 2.3 10*3/uL (ref 1.4–7.0)
Neutrophils: 46 %
Platelets: 256 10*3/uL (ref 150–450)
RBC: 3.85 x10E6/uL (ref 3.77–5.28)
RDW: 13.3 % (ref 11.7–15.4)
WBC: 5.2 10*3/uL (ref 3.4–10.8)

## 2021-03-15 ENCOUNTER — Other Ambulatory Visit: Payer: Self-pay

## 2021-03-15 ENCOUNTER — Ambulatory Visit: Payer: BC Managed Care – PPO | Admitting: Family Medicine

## 2021-03-15 ENCOUNTER — Encounter: Payer: Self-pay | Admitting: Family Medicine

## 2021-03-15 VITALS — BP 120/80 | HR 69 | Ht 68.0 in | Wt 191.0 lb

## 2021-03-15 DIAGNOSIS — Z79899 Other long term (current) drug therapy: Secondary | ICD-10-CM

## 2021-03-15 DIAGNOSIS — R5383 Other fatigue: Secondary | ICD-10-CM | POA: Diagnosis not present

## 2021-03-15 DIAGNOSIS — G40309 Generalized idiopathic epilepsy and epileptic syndromes, not intractable, without status epilepticus: Secondary | ICD-10-CM

## 2021-03-15 DIAGNOSIS — G35 Multiple sclerosis: Secondary | ICD-10-CM | POA: Diagnosis not present

## 2021-03-15 DIAGNOSIS — R269 Unspecified abnormalities of gait and mobility: Secondary | ICD-10-CM

## 2021-03-15 NOTE — Progress Notes (Addendum)
Chief Complaint  Patient presents with   Follow-up    Pt alone, EMG rm3, pt following up. Recevied Tysabri 03/14/2021. [facility] infusion. States all is well.     HISTORY OF PRESENT ILLNESS: 03/15/21 ALL:  Ruth Anderson is a 50 y.o. female here today for follow up for seizures and RRMS.   She is doing well, today. She denies new or worsening symptoms. She takes methocarbamol as needed that helps with spasms and spasticity. She continues to have chronic fatigue. She is trying to get started with an exercise regimen. She continues modafinil that does help. Oxybutynin as needed helps with urinary frequency. She is sleeping well and feels mood is good.   She continues Keppra 1500mg  BID and Lamictal 200mg  BID (brand medically necessary).  No seizure activity.   HISTORY (copied from Dr previous note)  Ruth Anderson is a 50 year old woman with relapsing remitting multiple sclerosis and epilepsy.      Update1/19/2022 Her MS is stable and she has no exacerbarion or new symptom..  She is on Tysabri and tolerates it well.   She has been JCV  negative.     Gait is off and she has some falls on uneven surfaces, especially outside.  She is off balanced off flat surfaces.    She holds the bannister on stairs.   She has bladder urgency and now rarely takes oxybutynin.   Visual acuity either eye is fine with glasses but she has diplopia   She has no recent seizures.   She is on Keppra and Lamictal   She feels diplopia was worse with lamotrigine and we tried Keppra plus Vimpat and Keppra plus zonisamide but she had more trouble tolerating those combinations.   She has fatigue most days and decreased focus/attention/verbal fluency/processing.   Both the fstigue and cognitive issues improved with phentermine and more recently on modafinil with benefit . She sleeps well most nights.    She notes mild depression - she has more stress with husband having recent orthopedic surgery and requiring her help.         Her husband had Covid-19 and was admitted due to pneumonia.   He is back home.   She was tested twice and was negative.  She has not don the vaccination.   She is reluctant to do so as she had issues with other vaccinations.        MS History:  She was diagnosed with MS in 1988 placed on Betaseron when it became available.  Later on she switched to Copaxone and Avonex. When Tysabri became available in 2006, she switched and had 70 infusions over the next 6  years (monthly except for a 6 month holiday).   She tolerated Tysabri well, was stable and was JCV antibody negative. However, she did not like having to do the monthly to hour infusions. In May 2013, she started Tecfidera. She had flushing that persisted after many months but did not have significant GI issues.  She went back to 2007 in 2016.   Vitamin D was 32.9 in July 2015. Lymphocyte count was 0.8 on 07/29/2014.   Seizure History:   Her first seizure was in 1998 and they have been triggered by flashing Christmas tree lights. She has had about a dozen seizures overall with most of them occurring out of sleep but with a couple of them occurring during the daytime with severe ictal grogginess. For the most part, the seizures have been controlled on Lamictal 200  mg 3 times a day and Keppra 1500 mg twice a day.    Of note, she had breakthrough seizure on generic so we have been writing for the brand.     Her last seizure was in 2014, shortly after her sister died.  She needed intubation.     Imaging: MRI of the brain 12/03/2019 showed no new lesions compared to the MRI from 09/23/2018:     MRI of the brain 09/23/2018 showed no new lesions compared to the MRI 11/02/2015.     MRI of the brain 11/02/2015 shows T2/FLAIR hyperintense foci in the cerebellum, brainstem and cerebral hemispheres in a pattern and configuration consistent with chronic demyelinating plaque associated with multiple sclerosis.    There is a normal enhancement pattern.. None of the  foci appeared to be acute.    The extent of demyelinating foci appears essentially unchanged since 11/25/2012.   REVIEW OF SYSTEMS: Out of a complete 14 system review of symptoms, the patient complains only of the following symptoms, fatigue, weakness and all other reviewed systems are negative.   ALLERGIES: Allergies  Allergen Reactions   Zonisamide     "made me loopy"     HOME MEDICATIONS: Outpatient Medications Prior to Visit  Medication Sig Dispense Refill   ibuprofen (ADVIL) 800 MG tablet TAKE 1 TABLET BY MOUTH 3 TIMES DAILY WITH FOOD AS NEEDED FOR PAIN & INFLAMMATION 270 tablet 0   KEPPRA 750 MG tablet TAKE 2 TABLETS TWICE A DAY 360 tablet 3   LAMICTAL 200 MG tablet TAKE 1 TABLET BY MOUTH TWO TIMES DAILY. BRAND NAME LAMICTAL FOR SEIZURE DISORDER 180 tablet 3   methocarbamol (ROBAXIN) 750 MG tablet TAKE 1 TABLET BY MOUTH 4 TIMES DAILY 120 tablet 5   modafinil (PROVIGIL) 200 MG tablet Take 1 tablet (200 mg total) by mouth 2 (two) times daily. 60 tablet 5   natalizumab (TYSABRI) 300 MG/15ML injection Inject into the vein.     oxybutynin (DITROPAN) 5 MG tablet Take 1 tablet (5 mg total) by mouth 2 (two) times daily. 180 tablet 3   VITAMIN D PO Take 5,000 Units by mouth daily.     Facility-Administered Medications Prior to Visit  Medication Dose Route Frequency Provider Last Rate Last Admin   gadopentetate dimeglumine (MAGNEVIST) injection 17 mL  17 mL Intravenous Once PRN Sater, Pearletha Furlichard A, MD         PAST MEDICAL HISTORY: Past Medical History:  Diagnosis Date   Multiple sclerosis (HCC)    Seizures (HCC)    Vision abnormalities      PAST SURGICAL HISTORY: Past Surgical History:  Procedure Laterality Date   IR IMAGING GUIDED PORT INSERTION  09/14/2019   IR REMOVAL TUN ACCESS W/ PORT W/O FL MOD SED  09/14/2019     FAMILY HISTORY: Family History  Problem Relation Age of Onset   Hypertension Mother    Heart disease Father    Hypertension Father    Stroke Father     Diabetes type II Father      SOCIAL HISTORY: Social History   Socioeconomic History   Marital status: Married    Spouse name: Not on file   Number of children: Not on file   Years of education: Not on file   Highest education level: Not on file  Occupational History   Not on file  Tobacco Use   Smoking status: Former   Smokeless tobacco: Never  Substance and Sexual Activity   Alcohol use: No  Alcohol/week: 0.0 standard drinks   Drug use: No   Sexual activity: Not on file  Other Topics Concern   Not on file  Social History Narrative   Not on file   Social Determinants of Health   Financial Resource Strain: Not on file  Food Insecurity: Not on file  Transportation Needs: Not on file  Physical Activity: Not on file  Stress: Not on file  Social Connections: Not on file  Intimate Partner Violence: Not on file     PHYSICAL EXAM  Vitals:   03/15/21 1354  BP: 120/80  Pulse: 69  Weight: 191 lb (86.6 kg)  Height: 5\' 8"  (1.727 m)   Body mass index is 29.04 kg/m.   Generalized: Well developed, in no acute distress  Cardiology: normal rate and rhythm, no murmur auscultated  Respiratory: clear to auscultation bilaterally    Neurological examination  Mentation: Alert oriented to time, place, history taking. Follows all commands speech and language fluent Cranial nerve II-XII: Pupils were equal round reactive to light. Extraocular movements were full, visual field were full on confrontational test. Facial sensation and strength were normal. Head turning and shoulder shrug  were normal and symmetric. Motor: The motor testing reveals 5 over 5 strength of all 4 extremities. Give away weakness noted of right hip flexion.  Sensory: Sensory testing is intact to soft touch on all 4 extremities. No evidence of extinction is noted.  Coordination: Cerebellar testing reveals good finger-nose-finger and heel-to-shin bilaterally.  Gait and station: Gait wide and mildly spastic,  no assistive device, mild left foot drop Reflexes: Deep tendon reflexes are symmetric and normal bilaterally.    DIAGNOSTIC DATA (LABS, IMAGING, TESTING) - I reviewed patient records, labs, notes, testing and imaging myself where available.  Lab Results  Component Value Date   WBC 5.2 03/13/2021   HGB 11.4 03/13/2021   HCT 34.7 03/13/2021   MCV 90 03/13/2021   PLT 256 03/13/2021      Component Value Date/Time   NA 144 10/31/2016 1031   K 5.4 (H) 10/31/2016 1031   CL 101 10/31/2016 1031   CO2 28 10/31/2016 1031   GLUCOSE 94 10/31/2016 1031   BUN 11 10/31/2016 1031   CREATININE 0.74 10/31/2016 1031   CALCIUM 9.9 10/31/2016 1031   PROT 6.9 10/31/2016 1031   ALBUMIN 4.5 10/31/2016 1031   AST 16 10/31/2016 1031   ALT 31 10/31/2016 1031   ALKPHOS 117 10/31/2016 1031   BILITOT 0.7 10/31/2016 1031   GFRNONAA 97 10/31/2016 1031   GFRAA 112 10/31/2016 1031   No results found for: CHOL, HDL, LDLCALC, LDLDIRECT, TRIG, CHOLHDL No results found for: 12/31/2016 No results found for: VITAMINB12 No results found for: TSH  No flowsheet data found.   No flowsheet data found.   ASSESSMENT AND PLAN  50 y.o. year old female  has a past medical history of Multiple sclerosis (HCC), Seizures (HCC), and Vision abnormalities. here with    Multiple sclerosis (HCC)  Epilepsy, generalized, convulsive (HCC)  High risk medication use  Other fatigue  Gait disturbance  Ruth Anderson is doing well, today. We will continue Tysabri infusions. Labs were unremarkable. We are awaiting JCV, always normal in the past. MRI in 2017 was stable. She does not wish to update at this time but will discuss with Dr 2018 at next visit. We will continue methocarbamol, modafinil, oxybutynin, brand only Keppra and Lamictal as prescribed. She will continue healthy lifestyle habits. I have encouraged her to start with 5-10 minutes  of daily activity and increase slowly as tolerated. She will follow up with Dr Epimenio Foot in 6  months, sooner if needed.   No orders of the defined types were placed in this encounter.    No orders of the defined types were placed in this encounter.     Shawnie Dapper, MSN, FNP-C 03/15/2021, 2:12 PM  [facility] [address], [address] [place], [place] [address] [phone]     I have read the note, and I agree with the clinical assessment and plan.  Richard A. Epimenio Foot, MD, PhD, FAAN Certified in Neurology, Clinical Neurophysiology, Sleep Medicine, Pain Medicine and Neuroimaging  [facility] [address] [place], [place] [address] [phone]    I have read the note, and I agree with the clinical assessment and plan.  Richard A. Epimenio Foot, MD, PhD, [facility] Certified in Neurology, Clinical Neurophysiology, Sleep Medicine, Pain Medicine and Neuroimaging  [facility] [address], [address] [place], [place] [address] [phone]

## 2021-03-15 NOTE — Patient Instructions (Signed)
Below is our plan:  We will continue current treatment plan. Try to increase activity slowly.   Please make sure you are staying well hydrated. I recommend 50-60 ounces daily. Well balanced diet and regular exercise encouraged. Consistent sleep schedule with 6-8 hours recommended.   Please continue follow up with care team as directed.   Follow up with Dr Epimenio Foot in 6 months   You may receive a survey regarding today's visit. I encourage you to leave honest feed back as I do use this information to improve patient care. Thank you for seeing me today!

## 2021-03-16 ENCOUNTER — Encounter: Payer: Self-pay | Admitting: Family Medicine

## 2021-03-16 ENCOUNTER — Other Ambulatory Visit: Payer: Self-pay | Admitting: Neurology

## 2021-03-17 LAB — STRATIFY JCV AB (W/ INDEX) W/ RFLX
Index Value: 0.2 — ABNORMAL HIGH
Stratify JCV (TM) Ab w/Reflex Inhibition: UNDETERMINED — AB

## 2021-03-17 LAB — RFLX STRATIFY JCV (TM) AB INHIBITION: JCV Antibody by Inhibition: NEGATIVE

## 2021-03-18 NOTE — Telephone Encounter (Signed)
JCV Ab is 0.20 (indeterminate) and inhibition assay is negative

## 2021-04-02 ENCOUNTER — Other Ambulatory Visit: Payer: Self-pay | Admitting: Neurology

## 2021-04-03 ENCOUNTER — Telehealth: Payer: Self-pay | Admitting: Neurology

## 2021-04-03 NOTE — Telephone Encounter (Signed)
Please call this patient to discuss if restrictions can be lifted to drive. Thank you

## 2021-04-03 NOTE — Telephone Encounter (Signed)
Called pt back. She wants to be cleared of all driving restrictions. Last seizure activity in 2014. Currently has restrictions to: Daylight driving only and can only drive so many miles from home. Advised I will discuss w/ MD to see if he clears her. Confirmed we received DMV form. Advised I will back out if there are any questions. She verbalized understanding.  She also states [employer] may reach out to get her another mobile chair.

## 2021-04-04 DIAGNOSIS — Z0289 Encounter for other administrative examinations: Secondary | ICD-10-CM

## 2021-04-04 NOTE — Telephone Encounter (Signed)
Called back and spoke w/ Aram Beecham. She transferred me to mobility specialist. Spoke w/ Pura Spice. Client has power wheel chair and due for replacement. Processed new application 03/09/21. Originally tried doing 03/2020 but it was too soon. Finishing up process to get her evaluated. She did three way call and we scheduled appt for 04/17/21 at 2pm. She will fax over form for Dr. Epimenio Foot to have for the visit at [phone].

## 2021-04-04 NOTE — Telephone Encounter (Signed)
[name]) called, would like to speak with a nurse about a mobility exam appt.  Contact: [phone]

## 2021-04-10 ENCOUNTER — Other Ambulatory Visit: Payer: Self-pay | Admitting: Neurology

## 2021-04-10 NOTE — Telephone Encounter (Signed)
I have completed the DMV forms for the patient and will provider to Debra to scan and send for the patient.

## 2021-04-11 ENCOUNTER — Telehealth: Payer: Self-pay | Admitting: *Deleted

## 2021-04-11 NOTE — Telephone Encounter (Signed)
Pt DMV form faxed on 03/1521

## 2021-04-17 ENCOUNTER — Ambulatory Visit: Payer: BC Managed Care – PPO | Admitting: Neurology

## 2021-04-17 ENCOUNTER — Encounter: Payer: Self-pay | Admitting: Neurology

## 2021-04-17 VITALS — BP 153/86 | HR 83 | Ht 68.0 in | Wt 190.0 lb

## 2021-04-17 DIAGNOSIS — G40309 Generalized idiopathic epilepsy and epileptic syndromes, not intractable, without status epilepticus: Secondary | ICD-10-CM | POA: Diagnosis not present

## 2021-04-17 DIAGNOSIS — G35 Multiple sclerosis: Secondary | ICD-10-CM | POA: Diagnosis not present

## 2021-04-17 DIAGNOSIS — Z79899 Other long term (current) drug therapy: Secondary | ICD-10-CM

## 2021-04-17 DIAGNOSIS — H532 Diplopia: Secondary | ICD-10-CM

## 2021-04-17 DIAGNOSIS — R269 Unspecified abnormalities of gait and mobility: Secondary | ICD-10-CM

## 2021-04-17 DIAGNOSIS — M21372 Foot drop, left foot: Secondary | ICD-10-CM

## 2021-04-17 NOTE — Progress Notes (Addendum)
[facility]  PATIENT: Ruth Anderson DOB: [birth year]  REFERRING DOCTOR OR PCP:  none SOURCE: patient, husband and records from [facility] neurology and images from [facility].  _________________________________   HISTORICAL  CHIEF COMPLAINT:  Chief Complaint  Patient presents with   Follow-up    Room 1. Here for power wheelchair mobility exam. [employer] stated she needed updated visit/notes per insurance.    HISTORY OF PRESENT ILLNESS:  Ruth Anderson is a 50 year old woman with relapsing remitting multiple sclerosis and epilepsy.     Update1/19/2022 ADL issues.  Due to poor mobility, she has difficulty performing her ADLs requirig her o go room to room such as using the toilet, other self care/hygiene, meal prep and simple household chores.     Mobility Issues:    Gait isunstable and she has had a multiple falls but none caused injury.  Probably has 1-2 a month.   She doesn't catch herself   She trips over curbs a lot.  She has a left > right foot drop.     She has difficulty using a cane or walker due to bilateral symptoms and fatigue.  Gait is worse in the afternoons.    Due to fatigue she cannot consistently self propel a lightweight or other manual wheelchair    She is currently using a [employer] around the house,but it is old and she needs a replacement.   A scooter will not allow her to accomplish her ADLs due to the turning radius and more difficulty getting on/off.    Therefore, she needs a power wheelchair.  She needs hand controls on her right.   She has the mental and physical capacity to operate this device.  She is motivated to do so.  She is able to perform weight shifts.   ____________________________________ Other MS and neurologic issues:    Her MS is stable and she has no exacerbarion or new symptom..  She is on Tysabri and tolerates it well.   She has been JCV  negative.     Was 0.20 negative 03/13/2021.    She has bladder urgency and  now rarely takes oxybutynin.   Visual acuity either eye is fine with glasses but she has diplopia when she looks to the right only.      She has no recent seizures.   She is on Keppra and Lamictal   She is trying to get her license back.   Last seizure was 2014 associated with stress and probable bad sleep.   She feels diplopia was worse with lamotrigine and we tried Keppra plus Vimpat and Keppra plus zonisamide but she had more trouble tolerating those combinations so went back to lamotrigine.    She has fatigue most days and decreased focus/attention/verbal fluency/processing.   Both the fstigue and cognitive issues improved with phentermine and more recently on modafinil with benefit . She sleeps well most nights - going to bed around 8 pm and wakes up at 5 am   She snores No OSA signs.      She notes mild depression  which is stable.      MS History:  She was diagnosed with MS in 1988 placed on Betaseron when it became available.  Later on she switched to Copaxone and Avonex. When Tysabri became available in 2006, she switched and had 70 infusions over the next 6  years (monthly except for a 6 month holiday).   She tolerated Tysabri well, was stable and was JCV antibody negative.  However, she did not like having to do the monthly to hour infusions. In May 2013, she started Tecfidera. She had flushing that persisted after many months but did not have significant GI issues.  She went back to [place] in 2016.   Vitamin D was 32.9 in July 2015. Lymphocyte count was 0.8 on 07/29/2014.  Seizure History:   Her first seizure was in 1998 and they have been triggered by flashing Christmas tree lights. She has had about a dozen seizures overall with most of them occurring out of sleep but with a couple of them occurring during the daytime with severe ictal grogginess. For the most part, the seizures have been controlled on Lamictal 200 mg 3 times a day and Keppra 1500 mg twice a day.    Of note, she had  breakthrough seizure on generic so we have been writing for the brand.     Her last seizure was in 2014, shortly after her sister died.  She needed intubation.    Imaging: MRI of the brain 12/03/2019 showed no new lesions compared to the MRI from 09/23/2018:    MRI of the brain 09/23/2018 showed no new lesions compared to the MRI 11/02/2015.    MRI of the brain 11/02/2015 shows T2/FLAIR hyperintense foci in the cerebellum, brainstem and cerebral hemispheres in a pattern and configuration consistent with chronic demyelinating plaque associated with multiple sclerosis.    There is a normal enhancement pattern.. None of the foci appeared to be acute.    The extent of demyelinating foci appears essentially unchanged since 11/25/2012.    REVIEW OF SYSTEMS: Constitutional: No fevers, chills, sweats, or change in appetite.  Notes some fatigue Eyes: as above.  No eye pain Ear, nose and throat: No hearing loss, ear pain, nasal congestion, sore throat Cardiovascular: No chest pain, palpitations Respiratory:  No shortness of breath at rest or with exertion.   No wheezes GastrointestinaI: No nausea, vomiting, diarrhea, abdominal pain, fecal incontinence Genitourinary:  No dysuria, urinary retention or frequency.  No nocturia. Musculoskeletal:  No neck pain, back pain Integumentary: No rash, pruritus, skin lesions Neurological: as above Psychiatric: No depression at this time.  No anxiety Endocrine: No palpitations, diaphoresis, change in appetite, change in weigh or increased thirst Hematologic/Lymphatic:  No anemia, purpura, petechiae. Allergic/Immunologic: No itchy/runny eyes, nasal congestion, recent allergic reactions, rashes  ALLERGIES: Allergies  Allergen Reactions   Zonisamide     "made me loopy"    HOME MEDICATIONS:  Current Outpatient Medications:    ibuprofen (ADVIL) 800 MG tablet, TAKE 1 TABLET BY MOUTH 3 TIMES DAILY WITH FOOD AS NEEDED FOR PAIN & INFLAMMATION, Disp: 270 tablet, Rfl: 0    KEPPRA 750 MG tablet, TAKE 2 TABLETS TWICE A DAY, Disp: 360 tablet, Rfl: 3   LAMICTAL 200 MG tablet, TAKE 1 TABLET BY MOUTH TWO TIMES DAILY. BRAND NAME LAMICTAL FOR SEIZURE DISORDER, Disp: 180 tablet, Rfl: 3   methocarbamol (ROBAXIN) 750 MG tablet, TAKE 1 TABLET BY MOUTH 4 TIMES DAILY, Disp: 120 tablet, Rfl: 5   modafinil (PROVIGIL) 200 MG tablet, TAKE 1 TABLET BY MOUTH TWICE A DAY, Disp: 60 tablet, Rfl: 5   natalizumab (TYSABRI) 300 MG/15ML injection, Inject into the vein., Disp: , Rfl:    oxybutynin (DITROPAN) 5 MG tablet, Take 1 tablet (5 mg total) by mouth 2 (two) times daily., Disp: 180 tablet, Rfl: 3   VITAMIN D PO, Take 5,000 Units by mouth daily., Disp: , Rfl:  No current facility-administered medications for this  visit.  Facility-Administered Medications Ordered in Other Visits:    gadopentetate dimeglumine (MAGNEVIST) injection 17 mL, 17 mL, Intravenous, Once PRN, Ruth Anderson, Pearletha Furl, MD  PAST MEDICAL HISTORY: Past Medical History:  Diagnosis Date   Multiple sclerosis (HCC)    Seizures (HCC)    Vision abnormalities     PAST SURGICAL HISTORY: Past Surgical History:  Procedure Laterality Date   IR IMAGING GUIDED PORT INSERTION  09/14/2019   IR REMOVAL TUN ACCESS W/ PORT W/O FL MOD SED  09/14/2019    FAMILY HISTORY: Family History  Problem Relation Age of Onset   Hypertension Mother    Heart disease Father    Hypertension Father    Stroke Father    Diabetes type II Father     SOCIAL HISTORY:  Social History   Socioeconomic History   Marital status: Married    Spouse name: Not on file   Number of children: Not on file   Years of education: Not on file   Highest education level: Not on file  Occupational History   Not on file  Tobacco Use   Smoking status: Former   Smokeless tobacco: Never  Substance and Sexual Activity   Alcohol use: No    Alcohol/week: 0.0 standard drinks   Drug use: No   Sexual activity: Not on file  Other Topics Concern   Not on file   Social History Narrative   Not on file   Social Determinants of Health   Financial Resource Strain: Not on file  Food Insecurity: Not on file  Transportation Needs: Not on file  Physical Activity: Not on file  Stress: Not on file  Social Connections: Not on file  Intimate Partner Violence: Not on file     PHYSICAL EXAM  Vitals:   04/17/21 1345  BP: (!) 153/86  Pulse: 83  Weight: 190 lb (86.2 kg)  Height: 5\' 8"  (1.727 m)    Body mass index is 28.89 kg/m.   General: The patient is well-developed and well-nourished and in no acute distress  Neurologic Exam  Mental status: The patient is alert and oriented x 3 at the time of the examination. The patient has apparent normal recent and remote memory, with an apparently normal attention span and concentration ability.   Speech is normal.  Cranial nerves: She noted mild diplopia but I could not see a dysconjugate gaze looking up to the right.  There is no nystagmus.  Visual acuity is symmetric. Facial strength and sensation are normal. Trapezius strength is normal..   No obvious hearing deficits are noted.  Motor:  Muscle bulk is normal.  She has mildly increased muscle tone in the legs, left greater than right.  . Strength is  5 / 5 in the arms but 4/5 in left leg and 4+/5 in the right  toes and ankles  Sensory:   She has normal sensation to touch and vibration.  Coordination: Cerebellar testing reveals good finger-nose-finger and heel-to-shin bilaterally.  Gait and station: Station is normal.   The gait is spastic, left greater than right.  There is a left foot drop and the gait is wide.  She cannot tandem walk  Romberg is negative.   Reflexes: Deep tendon reflexes are symmetric and brisk bilaterally.         ASSESSMENT AND PLAN  Multiple sclerosis (HCC)  Gait disturbance  Epilepsy, generalized, convulsive (HCC)  High risk medication use  Left foot drop  Diplopia   1. I will complete forms  for her to get a  replacement [employer].    See above for details.     She has the mental and physical capacity to operate this device.  She is motivated to do so.   2.  For MS, Continue Tysabri.    Check JCV and CBC when she comes in for her next infusion.    3.  For seizures, continue Keppra   (brand necessary as she had a severe breakthrough seizure while on generic requiring intubation).   Continue Lamictal.      Continue modafinil for fatigue.     Continue prn oxybutynin for bladder issues. 4.  She will return to see me in 6 months will sooner if she has new or worsening neurologic symptoms.  40-minute office visit with the majority of the time spent face-to-face for history and physical, discussion/counseling and decision-making.  Additional time with record review and documentation.  Tesla Bochicchio A. Epimenio Foot, MD, PhD 04/17/2021, 2:43 PM Certified in Neurology, Clinical Neurophysiology, Sleep Medicine, Pain Medicine and Neuroimaging  [facility] [address], [address] [place], [place] [address] [phone]

## 2021-04-18 ENCOUNTER — Telehealth: Payer: Self-pay | Admitting: *Deleted

## 2021-04-18 NOTE — Telephone Encounter (Signed)
Faxed completed/signed order, office notes, PT referral to [facility] at [phone], attn: Clinical documentation dept. Received fax confirmation.

## 2021-04-20 NOTE — Telephone Encounter (Signed)
Hoveround (Deb) called, need to two questions addendum to 8/22 office note; whether or not pt can perform weight shift, Why pt can not self propel manual wheelchair. Also a therapy evaluation is required. Would need a referral sent to the therapist.  Contact info: [phone] Reference no: [id number]

## 2021-04-20 NOTE — Telephone Encounter (Signed)
Referral already sent for therapy evaluation.  Will forward to MD to complete once he returns to office next week

## 2021-04-20 NOTE — Telephone Encounter (Signed)
Pt would like a call from [place], [place] re: the needed evaluation for her hoveround .  Please call pt at home #

## 2021-04-25 ENCOUNTER — Telehealth: Payer: Self-pay | Admitting: Neurology

## 2021-04-25 NOTE — Telephone Encounter (Signed)
Pt called needing to speak to the nurse regarding the [facility] chair. Pt would not give any other detail.

## 2021-04-25 NOTE — Telephone Encounter (Signed)
Called the patient back. She was slightly confused on what referral had been placed. Advised that a referral to PT was placed in order to have eval completed for Hoverround. Pt was unaware that this was taking place. Provided her the phone num to be able to reach out to neuro rehab if she hasn't heard from anyone. Pt verbalized understanding.

## 2021-04-25 NOTE — Telephone Encounter (Signed)
error 

## 2021-04-27 NOTE — Telephone Encounter (Signed)
Faxed updated noted to [facility] at [phone]. Received fax confirmation.

## 2021-05-04 NOTE — Telephone Encounter (Signed)
Called pt back. She has not heard from PT to schedule appt for WC eval. Advised I see note from 08/24 that they placed her on wait list. She will call them at [phone] to get update. Nothing further needed.

## 2021-05-04 NOTE — Telephone Encounter (Signed)
Pt called stating that the doctor that was supposed to call her about getting her Hoveround has not called her. Please advise.

## 2021-07-18 ENCOUNTER — Telehealth: Payer: Self-pay | Admitting: Neurology

## 2021-07-18 NOTE — Telephone Encounter (Signed)
Pt called states she needs some paper work  completed, but she would like a returned call before she brings the paper work in.

## 2021-07-18 NOTE — Telephone Encounter (Signed)
Completed couple sections that were missing and have faxed to [facility] for the pt

## 2021-07-18 NOTE — Telephone Encounter (Signed)
Called the patient back. She states that she was informed from the [facility] that something was missing on the forms that were sent in august. Advised that we would look at them and resend back. She was appreciative for the call.

## 2021-07-26 ENCOUNTER — Ambulatory Visit: Payer: BC Managed Care – PPO | Admitting: Rehabilitation

## 2021-07-26 ENCOUNTER — Telehealth: Payer: Self-pay | Admitting: Neurology

## 2021-07-26 NOTE — Telephone Encounter (Signed)
Pt called needing to speak to the RN regarding the paperwork that needs to be filled out. Please advise.

## 2021-07-26 NOTE — Telephone Encounter (Signed)
Called the patient back.  Patient was inquiring if the paperwork for the [facility] was completed and resent to the [facility] office.  Advised the patient this information was faxed on 07/18/21 and I have received confirmation that it went through.  Patient will contact their office to check on the status.

## 2021-09-05 ENCOUNTER — Other Ambulatory Visit: Payer: Self-pay

## 2021-09-05 ENCOUNTER — Ambulatory Visit: Payer: BC Managed Care – PPO | Attending: Neurology | Admitting: Physical Therapy

## 2021-09-05 DIAGNOSIS — M21372 Foot drop, left foot: Secondary | ICD-10-CM | POA: Diagnosis present

## 2021-09-05 DIAGNOSIS — R2681 Unsteadiness on feet: Secondary | ICD-10-CM | POA: Insufficient documentation

## 2021-09-05 DIAGNOSIS — R2689 Other abnormalities of gait and mobility: Secondary | ICD-10-CM | POA: Insufficient documentation

## 2021-09-05 DIAGNOSIS — M6281 Muscle weakness (generalized): Secondary | ICD-10-CM | POA: Diagnosis present

## 2021-09-06 ENCOUNTER — Encounter: Payer: Self-pay | Admitting: Physical Therapy

## 2021-09-06 NOTE — Therapy (Signed)
[facility] [address] [place], [place], [address] Phone: [phone]   Fax:  [phone]  Physical Therapy Evaluation  Patient Details  Name: Ruth Anderson MRN: [id number] Date of Birth: 1970-10-17 Referring Provider (PT): Dr. Despina Arias   Encounter Date: 09/05/2021   PT End of Session - 09/06/21 1306     Visit Number 1    Authorization Type BCBS    PT Start Time 1105    PT Stop Time 1214    PT Time Calculation (min) 69 min    Activity Tolerance Patient tolerated treatment well    Behavior During Therapy [facility] for tasks assessed/performed             Past Medical History:  Diagnosis Date   Multiple sclerosis (HCC)    Seizures (HCC)    Vision abnormalities     Past Surgical History:  Procedure Laterality Date   IR IMAGING GUIDED PORT INSERTION  09/14/2019   IR REMOVAL TUN ACCESS W/ PORT W/O FL MOD SED  09/14/2019    There were no vitals filed for this visit.    Subjective Assessment - 09/06/21 1258     Subjective Pt presents for power wheelchair evaluation - is amb. without a device but with mildly unsteady gait pattern    Pertinent History Relapsing- remitting MS; epilepsy    Patient Stated Goals obtain new power wheelchair    Currently in Pain? No/denies                [facility] PT Assessment - 09/06/21 0001       Assessment   Medical Diagnosis MS    Referring Provider (PT) Dr. Despina Arias    Onset Date/Surgical Date --   MS diagnosis 1988; referral date 04-17-21   Hand Dominance Right      Precautions   Precautions Fall      Balance Screen   Has the patient fallen in the past 6 months Yes    How many times? 10+    Has the patient had a decrease in activity level because of a fear of falling?  Yes    Is the patient reluctant to leave their home because of a fear of falling?  No      Prior Function   Level of Independence Independent with basic ADLs;Independent with household  mobility without device;Independent with transfers;Independent with community mobility without device                LMN for power w/c to be completed when quote received from vendor - [employer] Recommend Permobil F3 with power tilt & seat elevator        Objective measurements completed on examination: See above findings.                             Plan - 09/06/21 1307     Clinical Impression Statement Pt is a 51 yr old lady with MS who presents for power wheelchair evaluation.  Pt is amb. without device at today's session but reports "today is a good day" and states her status fluctuates depending on fatigue.  Pt evaluated for power w/c - Permobil F3 with seat elevator and power tilt recommended.    Personal Factors and Comorbidities Comorbidity 1;Time since onset of injury/illness/exacerbation    Comorbidities MS, epilepsy    Examination-Activity Limitations Transfers;Locomotion Level;Squat;Stairs;Stand;Caring for Others;Bend    Examination-Participation Restrictions Cleaning;Community Activity;Interpersonal Relationship;Laundry;Meal Prep;Shop  Stability/Clinical Decision Making Evolving/Moderate complexity    Clinical Decision Making Moderate    PT Frequency One time visit    PT Treatment/Interventions Other (comment)   evaluation - for power wheelchair   Consulted and Agree with Plan of Care Patient             Patient will benefit from skilled therapeutic intervention in order to improve the following deficits and impairments:  Difficulty walking, Decreased activity tolerance, Decreased balance, Decreased strength  Visit Diagnosis: Other abnormalities of gait and mobility - Plan: PT plan of care cert/re-cert  Foot drop, left - Plan: PT plan of care cert/re-cert  Muscle weakness (generalized) - Plan: PT plan of care cert/re-cert  Unsteadiness on feet - Plan: PT plan of care cert/re-cert     Problem List Patient Active Problem  List   Diagnosis Date Noted   High risk medication use 03/10/2020   Fall 03/11/2018   Lower back pain 03/11/2018   Left foot drop 05/14/2017   Obesity 05/02/2016   UTI (urinary tract infection) 10/31/2015   Urinary hesitancy 10/31/2015   Disturbed cognition 10/31/2015   Multiple sclerosis (HCC) 12/15/2014   Epilepsy, generalized, 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 [place], [place], [address] Phone: [phone]   Fax:  [phone]  Name: Ruth Anderson MRN: [id number] Date of Birth: 21-Mar-1971

## 2021-09-11 ENCOUNTER — Other Ambulatory Visit: Payer: Self-pay | Admitting: Neurology

## 2021-09-11 ENCOUNTER — Other Ambulatory Visit (INDEPENDENT_AMBULATORY_CARE_PROVIDER_SITE_OTHER): Payer: Self-pay

## 2021-09-11 ENCOUNTER — Other Ambulatory Visit: Payer: Self-pay | Admitting: *Deleted

## 2021-09-11 DIAGNOSIS — G35 Multiple sclerosis: Secondary | ICD-10-CM

## 2021-09-11 DIAGNOSIS — Z79899 Other long term (current) drug therapy: Secondary | ICD-10-CM

## 2021-09-11 DIAGNOSIS — Z0289 Encounter for other administrative examinations: Secondary | ICD-10-CM

## 2021-09-11 NOTE — Telephone Encounter (Signed)
Received refill request for modafinil.  Last OV was on 04/17/21.  Next OV is scheduled for 02/22/21 .  Last RX was written on 08/13/21 for 30 tabs.   [place] Drug Database has been reviewed.

## 2021-09-12 ENCOUNTER — Ambulatory Visit: Payer: BC Managed Care – PPO | Admitting: Neurology

## 2021-09-21 LAB — CBC WITH DIFFERENTIAL/PLATELET
Basophils Absolute: 0.1 10*3/uL (ref 0.0–0.2)
Basos: 1 %
EOS (ABSOLUTE): 0.4 10*3/uL (ref 0.0–0.4)
Eos: 8 %
Hematocrit: 34.8 % (ref 34.0–46.6)
Hemoglobin: 12.1 g/dL (ref 11.1–15.9)
Immature Grans (Abs): 0 10*3/uL (ref 0.0–0.1)
Immature Granulocytes: 0 %
Lymphocytes Absolute: 2.3 10*3/uL (ref 0.7–3.1)
Lymphs: 47 %
MCH: 30.8 pg (ref 26.6–33.0)
MCHC: 34.8 g/dL (ref 31.5–35.7)
MCV: 89 fL (ref 79–97)
Monocytes Absolute: 0.5 10*3/uL (ref 0.1–0.9)
Monocytes: 11 %
Neutrophils Absolute: 1.6 10*3/uL (ref 1.4–7.0)
Neutrophils: 33 %
Platelets: 316 10*3/uL (ref 150–450)
RBC: 3.93 x10E6/uL (ref 3.77–5.28)
RDW: 13.1 % (ref 11.7–15.4)
WBC: 4.9 10*3/uL (ref 3.4–10.8)

## 2021-09-21 LAB — STRATIFY JCV(TM) AB W/INDEX
JCV Antibody by Inhibition: NEGATIVE
JCV Antibody: UNDETERMINED
JCV Index Value: 0.26

## 2021-09-25 ENCOUNTER — Telehealth: Payer: Self-pay

## 2021-09-25 NOTE — Telephone Encounter (Signed)
I have submitted a PA request for brand Keppra 750mg  on CMM, Key: [id number] - PA Case ID: . Awaiting determination from [facility].

## 2021-10-02 NOTE — Telephone Encounter (Signed)
Appeal approved: "[id number];Review Type:Prior Auth;Coverage Start Date:09/18/2021;Coverage End Date:10/02/2022;"

## 2021-10-02 NOTE — Telephone Encounter (Signed)
PA denied. Submitted Eappeal on CMM. Attached office notes and marked urgent. Waiting on determination.

## 2021-10-19 ENCOUNTER — Telehealth: Payer: Self-pay | Admitting: *Deleted

## 2021-10-19 NOTE — Telephone Encounter (Signed)
Submitted PA modafinil on CMM. KeyES:[id number]. Received instant approval: "CaseId:[id number];Status:Approved;Review Type:Prior Auth;Coverage Start Date:09/19/2021;Coverage End Date:10/19/2022;"

## 2021-10-23 ENCOUNTER — Other Ambulatory Visit: Payer: Self-pay | Admitting: Neurology

## 2021-10-24 NOTE — Telephone Encounter (Signed)
Received refill request for modafinil.  Last OV was on 04/17/21.  Next OV is scheduled for 02/22/22 .  Last RX was written on 09/11/21 for 60 tabs.   [place] Drug Database has been reviewed.

## 2021-12-09 ENCOUNTER — Other Ambulatory Visit: Payer: Self-pay | Admitting: Neurology

## 2021-12-11 NOTE — Telephone Encounter (Signed)
Last OV was on 04/17/21.  ?Next OV is scheduled for 02/22/22.  ?Last RX was written on 10/24/21 for 60 tabs.  ? ?[place] Drug Database has been reviewed.  ?

## 2022-01-11 ENCOUNTER — Other Ambulatory Visit: Payer: Self-pay | Admitting: Neurology

## 2022-01-11 NOTE — Telephone Encounter (Signed)
Last OV was on 04/17/21.  Next OV is scheduled for 02/22/22.  Last RX was written on 12/11/21 for 60 tabs.   [place] Drug Database has been reviewed.

## 2022-01-15 ENCOUNTER — Telehealth: Payer: Self-pay

## 2022-01-15 NOTE — Telephone Encounter (Signed)
Submitted PA on CMM.  Key: [id number].  Waiting on determination from [facility] Electronic PA Form [id number] NCPDP).

## 2022-01-15 NOTE — Telephone Encounter (Signed)
PA started. Awaiting determination

## 2022-01-17 NOTE — Telephone Encounter (Signed)
Received a questionnaire to complete for the authorization. Have completed. Also received a denial letter on same day as the questions that were sent came through. We will send the answers in response to the questions with hopes they will reopen and address the original auth.

## 2022-01-17 NOTE — Telephone Encounter (Signed)
PA approved for the patient until 01/18/2023 through [facility]

## 2022-01-17 NOTE — Telephone Encounter (Signed)
Pt is asking for a call to discuss that re: the LAMICTAL 200 MG tablet, she can not take generic, the prescription must be for brand only.

## 2022-01-17 NOTE — Telephone Encounter (Signed)
Called and spoke to pt. Relayed that PA was approved and she is good to get name brand. Pt was appreciative of call back and had no further questions at this time.

## 2022-01-23 ENCOUNTER — Encounter: Payer: Self-pay | Admitting: Neurology

## 2022-02-12 ENCOUNTER — Telehealth: Payer: Self-pay | Admitting: Neurology

## 2022-02-12 ENCOUNTER — Other Ambulatory Visit: Payer: Self-pay | Admitting: Neurology

## 2022-02-12 MED ORDER — MODAFINIL 200 MG PO TABS: 200.0000 mg | ORAL_TABLET | Freq: Two times a day (BID) | ORAL | 0 refills | Status: DC

## 2022-02-12 NOTE — Telephone Encounter (Signed)
Called the pt back and advised that we needed to her her in for a follow up apt. I was able to schedule with Dr Epimenio Foot tomorrow and will send the refill for her in the meantime.

## 2022-02-12 NOTE — Telephone Encounter (Signed)
Pt called wanting to know why her modafinil (PROVIGIL) 200 MG tablet was denied a refill

## 2022-02-13 ENCOUNTER — Ambulatory Visit: Payer: BC Managed Care – PPO | Admitting: Neurology

## 2022-02-15 ENCOUNTER — Ambulatory Visit: Payer: BC Managed Care – PPO | Admitting: Neurology

## 2022-02-15 ENCOUNTER — Encounter: Payer: Self-pay | Admitting: Neurology

## 2022-02-15 ENCOUNTER — Telehealth: Payer: Self-pay | Admitting: *Deleted

## 2022-02-15 VITALS — BP 147/86 | HR 70 | Ht 68.0 in | Wt 199.0 lb

## 2022-02-15 DIAGNOSIS — G35 Multiple sclerosis: Secondary | ICD-10-CM | POA: Diagnosis not present

## 2022-02-15 DIAGNOSIS — M21372 Foot drop, left foot: Secondary | ICD-10-CM

## 2022-02-15 DIAGNOSIS — G40309 Generalized idiopathic epilepsy and epileptic syndromes, not intractable, without status epilepticus: Secondary | ICD-10-CM | POA: Diagnosis not present

## 2022-02-15 DIAGNOSIS — Z79899 Other long term (current) drug therapy: Secondary | ICD-10-CM

## 2022-02-15 DIAGNOSIS — R5383 Other fatigue: Secondary | ICD-10-CM

## 2022-02-15 DIAGNOSIS — R269 Unspecified abnormalities of gait and mobility: Secondary | ICD-10-CM

## 2022-02-15 DIAGNOSIS — Z298 Encounter for other specified prophylactic measures: Secondary | ICD-10-CM | POA: Insufficient documentation

## 2022-02-15 DIAGNOSIS — H532 Diplopia: Secondary | ICD-10-CM

## 2022-02-15 DIAGNOSIS — E559 Vitamin D deficiency, unspecified: Secondary | ICD-10-CM

## 2022-02-15 NOTE — Progress Notes (Signed)
[facility]  PATIENT: Ruth Anderson DOB: 03-11-1971  REFERRING DOCTOR OR PCP:  none SOURCE: patient, husband and records from [facility] neurology and images from [facility].  _________________________________   HISTORICAL  CHIEF COMPLAINT:  Chief Complaint  Patient presents with   Follow-up    RM 1, alone. Last seen 04/17/21.  MS DMT: Tysabri. Takes keppra/lamictral for seizure management. She has missed several doses of Tysabri d/t no showing/not calling back to r/s. She will be infused today after apt.     HISTORY OF PRESENT ILLNESS:  Ruth Anderson is a 51 year old woman with relapsing remitting multiple sclerosis and epilepsy.     Update 02/15/2022 Her MS is stable and she has no exacerbarion or new symptom..  She is on Tysabri and tolerates it well.   She has been JCV  negative.       She has a reduced gait and balance.    She has occasional falls - usually tripping on uneven surfaces, especially outside - though a few on even surfaces too.    She holds the bannister on stairs.    Visual acuity either eye is fine with glasses but she has diplopia.  She has bladder urgency and nocturia but stopped oxybutynin   She has nocturia x 3 and sometimes takes a while to fall back asleep.     She has no recent seizures.   She is on brand Keppra and brand Lamictal   She feels diplopia was worse with generic lamotrigine and we tried Keppra plus Vimpat and Keppra plus zonisamide but she had more trouble tolerating those combinations.  She has fatigue most days and decreased focus/attention/verbal fluency/processing.   Both the fstigue and cognitive issues improved with phentermine and more recently on modafinil with benefit . She sleeps well most nights.    She notes mild depression - she has more stress with husband having recent orthopedic surgery and requiring her help.       Her husband had Covid-19 and was admitted due to pneumonia.   He is back home.   She  was tested twice and was negative.  She has not don the vaccination.   She is reluctant to do so as she had issues with other vaccinations.      MS History:  She was diagnosed with MS in 1988 placed on Betaseron when it became available.  Later on she switched to Copaxone and Avonex. When Tysabri became available in 2006, she switched and had 70 infusions over the next 6  years (monthly except for a 6 month holiday).   She tolerated Tysabri well, was stable and was JCV antibody negative. However, she did not like having to do the monthly to hour infusions. In May 2013, she started Tecfidera. She had flushing that persisted after many months but did not have significant GI issues.  She went back to [place] in 2016.   Vitamin D was 32.9 in July 2015. Lymphocyte count was 0.8 on 07/29/2014.  Seizure History:   Her first seizure was in 1998 and they have been triggered by flashing Christmas tree lights. She has had about a dozen seizures overall with most of them occurring out of sleep but with a couple of them occurring during the daytime with severe ictal grogginess. For the most part, the seizures have been controlled on Lamictal 200 mg 3 times a day and Keppra 1500 mg twice a day.    Of note, she had breakthrough seizure on generic so  we have been writing for the brand.     Her last seizure was in 2014, shortly after her sister died.  She needed intubation.    Imaging: MRI of the brain 12/03/2019 showed no new lesions compared to the MRI from 09/23/2018:    MRI of the brain 09/23/2018 showed no new lesions compared to the MRI 11/02/2015.    MRI of the brain 11/02/2015 shows T2/FLAIR hyperintense foci in the cerebellum, brainstem and cerebral hemispheres in a pattern and configuration consistent with chronic demyelinating plaque associated with multiple sclerosis.    There is a normal enhancement pattern.. None of the foci appeared to be acute.    The extent of demyelinating foci appears essentially unchanged  since 11/25/2012.    REVIEW OF SYSTEMS: Constitutional: No fevers, chills, sweats, or change in appetite.  Notes some fatigue Eyes: as above.  No eye pain Ear, nose and throat: No hearing loss, ear pain, nasal congestion, sore throat Cardiovascular: No chest pain, palpitations Respiratory:  No shortness of breath at rest or with exertion.   No wheezes GastrointestinaI: No nausea, vomiting, diarrhea, abdominal pain, fecal incontinence Genitourinary:  No dysuria, urinary retention or frequency.  No nocturia. Musculoskeletal:  No neck pain, back pain Integumentary: No rash, pruritus, skin lesions Neurological: as above Psychiatric: No depression at this time.  No anxiety Endocrine: No palpitations, diaphoresis, change in appetite, change in weigh or increased thirst Hematologic/Lymphatic:  No anemia, purpura, petechiae. Allergic/Immunologic: No itchy/runny eyes, nasal congestion, recent allergic reactions, rashes  ALLERGIES: Allergies  Allergen Reactions   Zonisamide     "made me loopy"    HOME MEDICATIONS:  Current Outpatient Medications:    ibuprofen (ADVIL) 800 MG tablet, TAKE 1 TABLET BY MOUTH 3 TIMES DAILY WITH FOOD AS NEEDED FOR PAIN & INFLAMMATION, Disp: 270 tablet, Rfl: 0   KEPPRA 750 MG tablet, TAKE 2 TABLETS TWICE A DAY, Disp: 360 tablet, Rfl: 3   LAMICTAL 200 MG tablet, TAKE 1 TABLET BY MOUTH TWO TIMES DAILY. BRAND NAME LAMICTAL FOR SEIZURE DISORDER, Disp: 180 tablet, Rfl: 3   methocarbamol (ROBAXIN) 750 MG tablet, TAKE 1 TABLET BY MOUTH 4 TIMES DAILY, Disp: 120 tablet, Rfl: 5   modafinil (PROVIGIL) 200 MG tablet, Take 1 tablet (200 mg total) by mouth 2 (two) times daily., Disp: 60 tablet, Rfl: 0   natalizumab (TYSABRI) 300 MG/15ML injection, Inject into the vein., Disp: , Rfl:    VITAMIN D PO, Take 5,000 Units by mouth daily., Disp: , Rfl:    oxybutynin (DITROPAN) 5 MG tablet, Take 1 tablet (5 mg total) by mouth 2 (two) times daily. (Patient not taking: Reported on  02/15/2022), Disp: 180 tablet, Rfl: 3 No current facility-administered medications for this visit.  Facility-Administered Medications Ordered in Other Visits:    gadopentetate dimeglumine (MAGNEVIST) injection 17 mL, 17 mL, Intravenous, Once PRN, Daveda Larock, Pearletha Furl, MD  PAST MEDICAL HISTORY: Past Medical History:  Diagnosis Date   Multiple sclerosis (HCC)    Seizures (HCC)    Vision abnormalities     PAST SURGICAL HISTORY: Past Surgical History:  Procedure Laterality Date   IR IMAGING GUIDED PORT INSERTION  09/14/2019   IR REMOVAL TUN ACCESS W/ PORT W/O FL MOD SED  09/14/2019    FAMILY HISTORY: Family History  Problem Relation Age of Onset   Hypertension Mother    Heart disease Father    Hypertension Father    Stroke Father    Diabetes type II Father     SOCIAL HISTORY:  Social History   Socioeconomic History   Marital status: Married    Spouse name: Not on file   Number of children: Not on file   Years of education: Not on file   Highest education level: Not on file  Occupational History   Not on file  Tobacco Use   Smoking status: Former   Smokeless tobacco: Never  Substance and Sexual Activity   Alcohol use: No    Alcohol/week: 0.0 standard drinks of alcohol   Drug use: No   Sexual activity: Not on file  Other Topics Concern   Not on file  Social History Narrative   Not on file   Social Determinants of Health   Financial Resource Strain: Not on file  Food Insecurity: Not on file  Transportation Needs: Not on file  Physical Activity: Not on file  Stress: Not on file  Social Connections: Not on file  Intimate Partner Violence: Not on file     PHYSICAL EXAM  Vitals:   02/15/22 0912  BP: (!) 147/86  Pulse: 70  Weight: 199 lb (90.3 kg)  Height: 5\' 8"  (1.727 m)    Body mass index is 30.26 kg/m.   General: The patient is well-developed and well-nourished and in no acute distress  Musculoskeletal: She is tender over L4-L5 midline    Neurologic Exam  Mental status: The patient is alert and oriented x 3 at the time of the examination. The patient has apparent normal recent and remote memory, with an apparently normal attention span and concentration ability.   Speech is normal.  Cranial nerves: She noted mild diplopia but I could not see a dysconjugate gaze looking up to the right.  There is no nystagmus.  Visual acuity is symmetric. Facial strength and sensation are normal. Trapezius strength is normal..   No obvious hearing deficits are noted.  Motor:  Muscle bulk is normal.  She has mildly increased muscle tone in the legs, left greater than right.  . Strength is  5 / 5 in the arms but 4+/5 in the left toes and ankles  Sensory:   She has normal sensation to touch and vibration.  Coordination: Cerebellar testing reveals good finger-nose-finger and heel-to-shin bilaterally.  Gait and station: Station is normal.   The gait is mildly spastic, left greater than right.  There is a mild left foot drop and the gait is mildly wide.  The tandem gait is wide.  Romberg is negative.  Reflexes: Deep tendon reflexes are symmetric and brisk bilaterally.         ASSESSMENT AND PLAN  Multiple sclerosis (HCC) - Plan: Stratify JCV Antibody Test ([facility]), CBC with Differential/Platelet, SARS-CoV-2 Semi-Quantitative Total Antibody, Spike  High risk medication use - Plan: Stratify JCV Antibody Test ([facility]), CBC with Differential/Platelet, SARS-CoV-2 Semi-Quantitative Total Antibody, Spike  Gait disturbance  Epilepsy, generalized, convulsive (HCC)  Left foot drop  Diplopia  Other fatigue  Encounter for immunotherapy - Plan: Stratify JCV Antibody Test ([facility]), CBC with Differential/Platelet, SARS-CoV-2 Semi-Quantitative Total Antibody, Spike  Vitamin D deficiency - Plan: VITAMIN D 25 Hydroxy (Vit-D Deficiency, Fractures)    1. Continue Tysabri.    Check JCV and CBC today and every 6 months  2.  Continue Keppra and Lamictal  (brand necessary as she had a severe breakthrough seizure while on generic requiring intubation).   3.  Continue modafinil for fatigue.  She may have done slightly better with phentermine and that could be reconsidered in the future. 4.   Continue  prn oxybutynin (rarely takes) 5   Left AFO brace 6.  She will return to see me in 6 months will sooner if she has new or worsening neurologic symptoms.   Rael Tilly A. Epimenio Foot, MD, PhD 02/15/2022, 10:04 AM Certified in Neurology, Clinical Neurophysiology, Sleep Medicine, Pain Medicine and Neuroimaging  [facility] [address] [place], [place] [address] [phone]

## 2022-02-15 NOTE — Telephone Encounter (Signed)
Placed JCV lab in [facility] lock box for routine lab pick up. Results pending. 

## 2022-02-15 NOTE — Progress Notes (Signed)
Faxed below order to [employer] at [phone]. Received fax confirmation.

## 2022-02-16 LAB — CBC WITH DIFFERENTIAL/PLATELET
Basophils Absolute: 0.1 10*3/uL (ref 0.0–0.2)
Basos: 2 %
EOS (ABSOLUTE): 0.5 10*3/uL — ABNORMAL HIGH (ref 0.0–0.4)
Eos: 9 %
Hematocrit: 33.7 % — ABNORMAL LOW (ref 34.0–46.6)
Hemoglobin: 11.6 g/dL (ref 11.1–15.9)
Immature Grans (Abs): 0 10*3/uL (ref 0.0–0.1)
Immature Granulocytes: 0 %
Lymphocytes Absolute: 2.3 10*3/uL (ref 0.7–3.1)
Lymphs: 44 %
MCH: 30.2 pg (ref 26.6–33.0)
MCHC: 34.4 g/dL (ref 31.5–35.7)
MCV: 88 fL (ref 79–97)
Monocytes Absolute: 0.5 10*3/uL (ref 0.1–0.9)
Monocytes: 9 %
Neutrophils Absolute: 1.9 10*3/uL (ref 1.4–7.0)
Neutrophils: 36 %
Platelets: 283 10*3/uL (ref 150–450)
RBC: 3.84 x10E6/uL (ref 3.77–5.28)
RDW: 13.1 % (ref 11.7–15.4)
WBC: 5.2 10*3/uL (ref 3.4–10.8)

## 2022-02-16 LAB — SARS-COV-2 SEMI-QUANTITATIVE TOTAL ANTIBODY, SPIKE: SARS-CoV-2 Spike Ab Interp: POSITIVE

## 2022-02-16 LAB — VITAMIN D 25 HYDROXY (VIT D DEFICIENCY, FRACTURES): Vit D, 25-Hydroxy: 52.3 ng/mL (ref 30.0–100.0)

## 2022-02-16 LAB — SARS-COV-2 SPIKE AB DILUTION: SARS-CoV-2 Spike Ab Dilution: 1506 U/mL (ref <0.8)

## 2022-02-20 ENCOUNTER — Telehealth: Payer: Self-pay

## 2022-02-20 NOTE — Telephone Encounter (Signed)
I called patient.  I advised her that her labs were okay.  I advised her that her COVID test showed that she had COVID at some point in the past.  Patient verbalized understanding of results.  Patient had no questions or concerns at this time.

## 2022-02-22 ENCOUNTER — Telehealth: Payer: Self-pay | Admitting: Neurology

## 2022-02-22 ENCOUNTER — Ambulatory Visit: Payer: BC Managed Care – PPO | Admitting: Neurology

## 2022-02-22 NOTE — Telephone Encounter (Signed)
BCBS NPR via website sent to GI 

## 2022-02-26 NOTE — Telephone Encounter (Signed)
JCV ab drawn on 02/15/22 indeterminate, index: 0.20. Inhibition assay: negative.

## 2022-03-04 ENCOUNTER — Other Ambulatory Visit: Payer: Self-pay | Admitting: Neurology

## 2022-03-06 ENCOUNTER — Other Ambulatory Visit: Payer: Self-pay | Admitting: Neurology

## 2022-03-07 ENCOUNTER — Other Ambulatory Visit: Payer: Self-pay | Admitting: Neurology

## 2022-03-07 ENCOUNTER — Telehealth: Payer: Self-pay | Admitting: Neurology

## 2022-03-07 NOTE — Telephone Encounter (Signed)
Pt is requesting a refill onLAMICTAL 200 MG tablet . Pt [id number].

## 2022-03-07 NOTE — Telephone Encounter (Signed)
Last OV was on 02/15/22.  Next OV is pending to be scheduled.  Last RX was written on 02/12/22 for 60 tabs.   [place] Drug Database has been reviewed.

## 2022-03-07 NOTE — Telephone Encounter (Signed)
Called the patient back and advised that we sent a 3 month supply in may for the patient with 3 refills. Informed the pt she should be ok with her medication until next year. Advised the pt to contact the pharmacy. Pt verbalized understanding.

## 2022-03-27 ENCOUNTER — Other Ambulatory Visit: Payer: Self-pay | Admitting: Neurology

## 2022-03-27 NOTE — Telephone Encounter (Signed)
Pt requesting medication for fatigue. Have had fatigue for 4 or 5 days. Would  like a call  from the nurse. Pt did not want to schedule an appt.

## 2022-03-27 NOTE — Telephone Encounter (Signed)
Called pt back. Confirmed she is still taking modafinil 200mg  po BID.  Has tried/failed: phentermine.  She wants me to ask MD if there is another option for her to try. Aware I will call back once I speak with MD.

## 2022-03-27 NOTE — Telephone Encounter (Signed)
Called pt back. Advised Dr. Epimenio Foot recommended Adderall XR 20mg . Pt asked if this will interfere with seizures. I reviewed and it does state it could lower seizure threshold. Aware I will have MD review and I will call her back in the morning. She verbalized understanding.

## 2022-03-29 MED ORDER — AMPHETAMINE-DEXTROAMPHET ER 20 MG PO CP24: 20.0000 mg | ORAL_CAPSULE | Freq: Every day | ORAL | 0 refills | Status: DC

## 2022-03-29 NOTE — Telephone Encounter (Signed)
Called the patient and advised that Dr Epimenio Foot states the risks of seizures with adderall XR are typically low. He would recommend her try this. I will place the order and have him send this in for her. Confirmed the pharmacy on file is correct. Pt verbalized understanding.

## 2022-04-09 ENCOUNTER — Other Ambulatory Visit: Payer: Self-pay

## 2022-04-09 MED ORDER — LAMOTRIGINE ER 200 MG PO TB24: 200.0000 mg | ORAL_TABLET | Freq: Two times a day (BID) | ORAL | 3 refills | Status: DC

## 2022-04-09 MED ORDER — LEVETIRACETAM 750 MG PO TABS: 1500.0000 mg | ORAL_TABLET | Freq: Two times a day (BID) | ORAL | 2 refills | Status: DC

## 2022-04-11 ENCOUNTER — Other Ambulatory Visit: Payer: Self-pay

## 2022-04-11 MED ORDER — IBUPROFEN 800 MG PO TABS: ORAL_TABLET | ORAL | 0 refills | Status: DC

## 2022-04-25 ENCOUNTER — Telehealth: Payer: Self-pay | Admitting: Neurology

## 2022-04-25 MED ORDER — AMPHETAMINE-DEXTROAMPHET ER 20 MG PO CP24: 20.0000 mg | ORAL_CAPSULE | Freq: Every day | ORAL | 0 refills | Status: DC

## 2022-04-25 NOTE — Telephone Encounter (Signed)
Spoke w/ Misty Stanley to clarify message. She made 6 month f/u for 09/18/22. Pt last seen 02/15/22.  Pt needing refill on amphetamine-dextroamphetamine (ADDERALL XR) 20 MG 24 hr capsule [phone]

## 2022-04-25 NOTE — Telephone Encounter (Signed)
6 month f/u

## 2022-04-26 ENCOUNTER — Telehealth: Payer: Self-pay | Admitting: Neurology

## 2022-04-26 ENCOUNTER — Other Ambulatory Visit: Payer: Self-pay | Admitting: Neurology

## 2022-04-26 MED ORDER — LAMICTAL 200 MG PO TABS: 200.0000 mg | ORAL_TABLET | Freq: Every day | ORAL | 1 refills | Status: DC

## 2022-04-26 MED ORDER — AMPHETAMINE-DEXTROAMPHET ER 25 MG PO CP24: 25.0000 mg | ORAL_CAPSULE | ORAL | 0 refills | Status: DC

## 2022-04-26 NOTE — Addendum Note (Signed)
Addended by: Judi Cong on: 04/26/2022 05:20 PM   Modules accepted: Orders

## 2022-04-26 NOTE — Telephone Encounter (Signed)
Refill for brand name has been forwarded to the pharmacy as requested

## 2022-04-26 NOTE — Telephone Encounter (Signed)
Called the patient and made her aware that Dr Epimenio Foot will increase to 25 mg XR. Advised this was sent to Dr Epimenio Foot for him to send to local pharmacy and the lamictal name bran refill was sent [facility] the [facility]. Pt verbalized understanding.

## 2022-04-26 NOTE — Addendum Note (Signed)
Addended by: Judi Cong on: 04/26/2022 05:15 PM   Modules accepted: Orders

## 2022-04-26 NOTE — Telephone Encounter (Signed)
Pt is asking for her LAMICTAL 200 MG (90 day brand name) tablet to be called into [facility]

## 2022-04-26 NOTE — Addendum Note (Signed)
Addended by: Asa Lente on: 04/26/2022 06:55 PM   Modules accepted: Orders

## 2022-04-26 NOTE — Telephone Encounter (Signed)
Pt is asking for a call to discuss a stronger dose of  amphetamine-dextroamphetamine (ADDERALL XR) 20 MG 24 hr capsule, pt states current dose is no longer strong enough

## 2022-04-27 ENCOUNTER — Other Ambulatory Visit: Payer: Self-pay | Admitting: Neurology

## 2022-05-01 ENCOUNTER — Other Ambulatory Visit: Payer: Self-pay | Admitting: *Deleted

## 2022-05-01 MED ORDER — LAMICTAL 200 MG PO TABS: 200.0000 mg | ORAL_TABLET | Freq: Three times a day (TID) | ORAL | 3 refills | Status: DC

## 2022-05-08 ENCOUNTER — Telehealth: Payer: Self-pay | Admitting: Neurology

## 2022-05-08 DIAGNOSIS — G40309 Generalized idiopathic epilepsy and epileptic syndromes, not intractable, without status epilepticus: Secondary | ICD-10-CM

## 2022-05-08 MED ORDER — LAMICTAL 200 MG PO TABS: 200.0000 mg | ORAL_TABLET | Freq: Two times a day (BID) | ORAL | 3 refills | Status: DC

## 2022-05-08 NOTE — Telephone Encounter (Signed)
Pt is calling. Requesting a nurse give her a call about medication.

## 2022-05-08 NOTE — Telephone Encounter (Signed)
Called pt. She has [facility] on [facility] phone about Lamictal. She only received #90 of Lamictal.  Advised we sent in rx 05/01/22 #270, [facility]. We gave VO. Aware we will call them to clarify what happened and will call her back. Pt concerned about paying another copay.   [facility] and spoke w/ Jonny Ruiz. States they filled brand Lamictal #90,05/05/22. Advised this was incorrect, pt takes TID and not daily. Provided verbal 05/01/22. States there is a qty limit. Advised Previous PA completed and approved effective 01/03/22-01/18/23. [id number] for qty 270. Verified they had rx Lamictal 200mg  #270 on file that was sent in on 04/26/22. She needs remaining qty 180 (received 90 out of the 270). He transferred me to pharmacist to further discuss since it is giving him "qty limit" denial.  Spoke w/ 04/28/22, Pharm tech. She states rx sent 04/26/22 was for 1 po qd #180 but they reduced qty to 90 since directions read once daliy.   Placed her on hold and called pt. She is taking 1 tablet po twice daily. Aware we will send in corrected script to pharmacy. Per 04/28/22 at pharmacy, they will do "change of therapy" and override to dispense #180.   She will d/c any other prescriptions on file for Lamictal since we are sending in new script. Total time of call: Ruth Anderson.

## 2022-05-15 ENCOUNTER — Other Ambulatory Visit: Payer: Self-pay

## 2022-05-15 MED ORDER — IBUPROFEN 800 MG PO TABS: ORAL_TABLET | ORAL | 0 refills | Status: DC

## 2022-05-15 MED ORDER — LEVETIRACETAM 750 MG PO TABS: 1500.0000 mg | ORAL_TABLET | Freq: Two times a day (BID) | ORAL | 2 refills | Status: DC

## 2022-05-17 ENCOUNTER — Telehealth: Payer: Self-pay | Admitting: Neurology

## 2022-05-17 ENCOUNTER — Other Ambulatory Visit: Payer: Self-pay | Admitting: *Deleted

## 2022-05-17 DIAGNOSIS — G40309 Generalized idiopathic epilepsy and epileptic syndromes, not intractable, without status epilepticus: Secondary | ICD-10-CM

## 2022-05-17 MED ORDER — KEPPRA 750 MG PO TABS: 1500.0000 mg | ORAL_TABLET | Freq: Two times a day (BID) | ORAL | 3 refills | Status: DC

## 2022-05-17 NOTE — Telephone Encounter (Signed)
Called pt and apologized since it looks like generic sent in by mistake. I resent rx for brand name/stating medically necessary to [facility] per pt request. Asked they d/c generic. Asked they also expedite. Pt verbalized understanding.

## 2022-05-17 NOTE — Telephone Encounter (Signed)
Pt said refill was generic for levETIRAcetam (KEPPRA) 750 MG tablet, but cannot take generic. Can only take the name brand. Would like a call from the nurse to discuss.

## 2022-05-28 ENCOUNTER — Other Ambulatory Visit: Payer: Self-pay | Admitting: Neurology

## 2022-05-28 MED ORDER — AMPHETAMINE-DEXTROAMPHET ER 25 MG PO CP24: 25.0000 mg | ORAL_CAPSULE | ORAL | 0 refills | Status: DC

## 2022-05-28 NOTE — Telephone Encounter (Signed)
Pt is requesting a refill for   amphetamine-dextroamphetamine (ADDERALL XR) 20 MG 24 hr capsule  .  [id number]

## 2022-05-28 NOTE — Addendum Note (Signed)
Addended by: Darleen Crocker on: 05/28/2022 09:42 AM   Modules accepted: Orders

## 2022-05-30 ENCOUNTER — Other Ambulatory Visit: Payer: Self-pay | Admitting: Neurology

## 2022-05-30 MED ORDER — AMPHETAMINE-DEXTROAMPHET ER 25 MG PO CP24: 25.0000 mg | ORAL_CAPSULE | ORAL | 0 refills | Status: DC

## 2022-05-30 NOTE — Telephone Encounter (Signed)
Pt has called re: the refill of her medication.  The message from [place], [place] was relayed that she asked the [id number] .  Pt asked it be noted she is out.

## 2022-06-25 ENCOUNTER — Other Ambulatory Visit: Payer: Self-pay

## 2022-06-25 ENCOUNTER — Other Ambulatory Visit: Payer: Self-pay | Admitting: Neurology

## 2022-06-25 MED ORDER — AMPHETAMINE-DEXTROAMPHET ER 25 MG PO CP24: 25.0000 mg | ORAL_CAPSULE | ORAL | 0 refills | Status: DC

## 2022-06-25 NOTE — Telephone Encounter (Signed)
Pt is calling. Requesting a refill on medication amphetamine-dextroamphetamine (ADDERALL XR) 25 MG 24 hr capsule. [id number]

## 2022-06-25 NOTE — Telephone Encounter (Signed)
I have routed this request to Dr Sater for review. The pt is due for the medication and [place] registry was verified.  

## 2022-06-26 ENCOUNTER — Other Ambulatory Visit: Payer: Self-pay | Admitting: Neurology

## 2022-06-26 MED ORDER — AMPHETAMINE-DEXTROAMPHET ER 25 MG PO CP24: 25.0000 mg | ORAL_CAPSULE | ORAL | 0 refills | Status: DC

## 2022-06-26 NOTE — Addendum Note (Signed)
Addended by: Darleen Crocker on: 06/26/2022 03:26 PM   Modules accepted: Orders

## 2022-06-26 NOTE — Telephone Encounter (Signed)
Na

## 2022-06-26 NOTE — Telephone Encounter (Signed)
Pt called stating that she only has one pill left of her amphetamine-dextroamphetamine (ADDERALL XR) 25 MG 24 hr capsule and she is needing a refill

## 2022-06-27 ENCOUNTER — Other Ambulatory Visit: Payer: Self-pay | Admitting: Neurology

## 2022-06-27 DIAGNOSIS — G40309 Generalized idiopathic epilepsy and epileptic syndromes, not intractable, without status epilepticus: Secondary | ICD-10-CM

## 2022-06-27 NOTE — Telephone Encounter (Signed)
Called the patient and advised [facility] and [facility]. Pt states she still gets [facility] and does not need a refill.

## 2022-07-23 ENCOUNTER — Other Ambulatory Visit: Payer: Self-pay | Admitting: Neurology

## 2022-07-23 MED ORDER — AMPHETAMINE-DEXTROAMPHET ER 25 MG PO CP24: 25.0000 mg | ORAL_CAPSULE | ORAL | 0 refills | Status: DC

## 2022-07-23 NOTE — Telephone Encounter (Signed)
Pt is requesting a refill for amphetamine-dextroamphetamine (ADDERALL XR) 25 MG 24 hr capsule.  [id number]  

## 2022-07-23 NOTE — Telephone Encounter (Signed)
Per drug registry, last refilled 06/26/22 #30. Last seen 02/15/22 and next f/u 09/18/22.

## 2022-08-14 ENCOUNTER — Other Ambulatory Visit: Payer: Self-pay | Admitting: *Deleted

## 2022-08-14 ENCOUNTER — Other Ambulatory Visit (INDEPENDENT_AMBULATORY_CARE_PROVIDER_SITE_OTHER): Payer: Self-pay

## 2022-08-14 ENCOUNTER — Telehealth: Payer: Self-pay | Admitting: *Deleted

## 2022-08-14 DIAGNOSIS — Z79899 Other long term (current) drug therapy: Secondary | ICD-10-CM

## 2022-08-14 DIAGNOSIS — G35 Multiple sclerosis: Secondary | ICD-10-CM

## 2022-08-14 DIAGNOSIS — G35D Multiple sclerosis, unspecified: Secondary | ICD-10-CM

## 2022-08-14 DIAGNOSIS — Z0289 Encounter for other administrative examinations: Secondary | ICD-10-CM

## 2022-08-14 NOTE — Telephone Encounter (Signed)
Placed JCV lab in [facility] lock box for routine lab pick up. Results pending. 

## 2022-08-15 LAB — CBC WITH DIFFERENTIAL/PLATELET
Basophils Absolute: 0.1 10*3/uL (ref 0.0–0.2)
Basos: 1 %
EOS (ABSOLUTE): 0.1 10*3/uL (ref 0.0–0.4)
Eos: 1 %
Hematocrit: 37.1 % (ref 34.0–46.6)
Hemoglobin: 12.3 g/dL (ref 11.1–15.9)
Immature Grans (Abs): 0 10*3/uL (ref 0.0–0.1)
Immature Granulocytes: 0 %
Lymphocytes Absolute: 2.3 10*3/uL (ref 0.7–3.1)
Lymphs: 41 %
MCH: 29.8 pg (ref 26.6–33.0)
MCHC: 33.2 g/dL (ref 31.5–35.7)
MCV: 90 fL (ref 79–97)
Monocytes Absolute: 0.7 10*3/uL (ref 0.1–0.9)
Monocytes: 12 %
NRBC: 1 % — ABNORMAL HIGH (ref 0–0)
Neutrophils Absolute: 2.5 10*3/uL (ref 1.4–7.0)
Neutrophils: 45 %
Platelets: 306 10*3/uL (ref 150–450)
RBC: 4.13 x10E6/uL (ref 3.77–5.28)
RDW: 13 % (ref 11.7–15.4)
WBC: 5.6 10*3/uL (ref 3.4–10.8)

## 2022-08-28 ENCOUNTER — Telehealth: Payer: Self-pay | Admitting: Neurology

## 2022-08-28 ENCOUNTER — Other Ambulatory Visit: Payer: Self-pay | Admitting: *Deleted

## 2022-08-28 MED ORDER — AMPHETAMINE-DEXTROAMPHET ER 25 MG PO CP24: 25.0000 mg | ORAL_CAPSULE | ORAL | 0 refills | Status: DC

## 2022-08-28 NOTE — Telephone Encounter (Signed)
Sent refill request to Dr. Leta Baptist since he is work in this afternoon. Dr. Felecia Shelling out.

## 2022-08-28 NOTE — Telephone Encounter (Signed)
Pt is requesting a refill for amphetamine-dextroamphetamine (ADDERALL XR) 25 MG 24 hr capsule.  [id number]

## 2022-08-30 NOTE — Telephone Encounter (Signed)
JCV ab drawn 08/14/22 indeterminate, index: 0.22. Inhibition assay: negative.

## 2022-09-18 ENCOUNTER — Telehealth: Payer: Self-pay

## 2022-09-18 ENCOUNTER — Encounter: Payer: Self-pay | Admitting: Neurology

## 2022-09-18 ENCOUNTER — Ambulatory Visit: Payer: BC Managed Care – PPO | Admitting: Neurology

## 2022-09-18 VITALS — BP 121/75 | HR 90 | Ht 68.0 in | Wt 169.5 lb

## 2022-09-18 DIAGNOSIS — R5383 Other fatigue: Secondary | ICD-10-CM

## 2022-09-18 DIAGNOSIS — G40309 Generalized idiopathic epilepsy and epileptic syndromes, not intractable, without status epilepticus: Secondary | ICD-10-CM

## 2022-09-18 DIAGNOSIS — Z79899 Other long term (current) drug therapy: Secondary | ICD-10-CM | POA: Diagnosis not present

## 2022-09-18 DIAGNOSIS — G35 Multiple sclerosis: Secondary | ICD-10-CM

## 2022-09-18 DIAGNOSIS — R269 Unspecified abnormalities of gait and mobility: Secondary | ICD-10-CM | POA: Diagnosis not present

## 2022-09-18 DIAGNOSIS — M21372 Foot drop, left foot: Secondary | ICD-10-CM

## 2022-09-18 NOTE — Progress Notes (Signed)
[facility]  PATIENT: Ruth Anderson DOB: [birth year]  REFERRING DOCTOR OR PCP:  none SOURCE: patient, husband and records from [facility] neurology and images from [facility].  _________________________________   HISTORICAL  CHIEF COMPLAINT:  Chief Complaint  Patient presents with   Room 2    Pt is here Alone. Pt states that things have been going okay. Pt states that she has a lot of Fatigue.     HISTORY OF PRESENT ILLNESS:  Ruth Anderson is a 52 year old woman with relapsing remitting multiple sclerosis and epilepsy.     Update 09/18/2022 Her MS is stable and she has no exacerbarion or new symptom..  She is on Tysabri and tolerates it well.   She has been JCV  negative.     (0.22 on 08/14/2022)  She has a reduced gait and balance.    Left foot drop is mild nitially and worsens as she goes longer distance.   She had a brace but the fit is poor.  She has occasional falls - usually tripping on uneven surfaces, especially outside - though a few on even surfaces too.    She holds the bannister on stairs.    Visual acuity either eye is fine with glasses but she has diplopia.  She has bladder urgency and nocturia but stopped oxybutynin and is doig fine.   Nocturia 2-3 times nightly but quickly falls back asleep..     She has no recent seizures.   She is on brand Keppra and brand Lamictal   She feels diplopia was worse with generic lamotrigine and we tried Keppra plus Vimpat and Keppra plus zonisamide but she had more trouble tolerating those combinations.  She has fatigue most days and decreased focus/attention/verbal fluency/processing.   Both the fstigue and cognitive issues improved with phentermine and more recently on modafinil with benefit . She sleeps well most nights.      She notes mild depression - she has more stress with husband having recent orthopedic surgery and requiring her help.     SHe has some stress with sister's death recently.         She  also has to take care of her grand-daughter   MS History:  She was diagnosed with MS in 1988 placed on Betaseron when it became available.  Later on she switched to Copaxone and Avonex. When Tysabri became available in 2006, she switched and had 70 infusions over the next 6  years (monthly except for a 6 month holiday).   She tolerated Tysabri well, was stable and was JCV antibody negative. However, she did not like having to do the monthly to hour infusions. In May 2013, she started Tecfidera. She had flushing that persisted after many months but did not have significant GI issues.  She went back to [place] in 2016.   Vitamin D was 32.9 in July 2015. Lymphocyte count was 0.8 on 07/29/2014.  Seizure History:   Her first seizure was in 1998 and they have been triggered by flashing Christmas tree lights. She has had about a dozen seizures overall with most of them occurring out of sleep but with a couple of them occurring during the daytime with severe ictal grogginess. For the most part, the seizures have been controlled on Lamictal 200 mg 3 times a day and Keppra 1500 mg twice a day.    Of note, she had breakthrough seizure on generic so we have been writing for the brand.     Her last  seizure was in 2014, shortly after her sister died.  She needed intubation.    Imaging: MRI of the brain 12/03/2019 showed no new lesions compared to the MRI from 09/23/2018:    MRI of the brain 09/23/2018 showed no new lesions compared to the MRI 11/02/2015.    MRI of the brain 11/02/2015 shows T2/FLAIR hyperintense foci in the cerebellum, brainstem and cerebral hemispheres in a pattern and configuration consistent with chronic demyelinating plaque associated with multiple sclerosis.    There is a normal enhancement pattern.. None of the foci appeared to be acute.    The extent of demyelinating foci appears essentially unchanged since 11/25/2012.    REVIEW OF SYSTEMS: Constitutional: No fevers, chills, sweats, or change in  appetite.  Notes some fatigue Eyes: as above.  No eye pain Ear, nose and throat: No hearing loss, ear pain, nasal congestion, sore throat Cardiovascular: No chest pain, palpitations Respiratory:  No shortness of breath at rest or with exertion.   No wheezes GastrointestinaI: No nausea, vomiting, diarrhea, abdominal pain, fecal incontinence Genitourinary:  No dysuria, urinary retention or frequency.  No nocturia. Musculoskeletal:  No neck pain, back pain Integumentary: No rash, pruritus, skin lesions Neurological: as above Psychiatric: No depression at this time.  No anxiety Endocrine: No palpitations, diaphoresis, change in appetite, change in weigh or increased thirst Hematologic/Lymphatic:  No anemia, purpura, petechiae. Allergic/Immunologic: No itchy/runny eyes, nasal congestion, recent allergic reactions, rashes  ALLERGIES: Allergies  Allergen Reactions   Zonisamide     "made me loopy"    HOME MEDICATIONS:  Current Outpatient Medications:    amphetamine-dextroamphetamine (ADDERALL XR) 25 MG 24 hr capsule, Take 1 capsule by mouth every morning., Disp: 30 capsule, Rfl: 0   ibuprofen (ADVIL) 800 MG tablet, TAKE 1 TABLET BY MOUTH 3 TIMES DAILY WITH FOOD AS NEEDED FOR PAIN & INFLAMMATION, Disp: 270 tablet, Rfl: 0   KEPPRA 750 MG tablet, Take 2 tablets (1,500 mg total) by mouth 2 (two) times daily., Disp: 360 tablet, Rfl: 3   LAMICTAL 200 MG tablet, Take 1 tablet (200 mg total) by mouth 2 (two) times daily. BRAND NAME LAMICTAL FOR SEIZURE DISORDER, Disp: 180 tablet, Rfl: 3   methocarbamol (ROBAXIN) 750 MG tablet, TAKE 1 TABLET BY MOUTH 4 TIMES DAILY, Disp: 120 tablet, Rfl: 5   modafinil (PROVIGIL) 200 MG tablet, TAKE 1 TABLET BY MOUTH TWICE A DAY, Disp: 60 tablet, Rfl: 0   natalizumab (TYSABRI) 300 MG/15ML injection, Inject into the vein., Disp: , Rfl:    oxybutynin (DITROPAN) 5 MG tablet, Take 1 tablet (5 mg total) by mouth 2 (two) times daily. (Patient not taking: Reported on  02/15/2022), Disp: 180 tablet, Rfl: 3   VITAMIN D PO, Take 5,000 Units by mouth daily., Disp: , Rfl:  No current facility-administered medications for this visit.  Facility-Administered Medications Ordered in Other Visits:    gadopentetate dimeglumine (MAGNEVIST) injection 17 mL, 17 mL, Intravenous, Once PRN, Zaylie Gisler, Nanine Means, MD  PAST MEDICAL HISTORY: Past Medical History:  Diagnosis Date   Multiple sclerosis ([place])    Seizures ([place])    Vision abnormalities     PAST SURGICAL HISTORY: Past Surgical History:  Procedure Laterality Date   IR IMAGING GUIDED PORT INSERTION  09/14/2019   IR REMOVAL TUN ACCESS W/ PORT W/O FL MOD SED  09/14/2019    FAMILY HISTORY: Family History  Problem Relation Age of Onset   Hypertension Mother    Heart disease Father    Hypertension Father  Stroke Father    Diabetes type II Father     SOCIAL HISTORY:  Social History   Socioeconomic History   Marital status: Married    Spouse name: Not on file   Number of children: Not on file   Years of education: Not on file   Highest education level: Not on file  Occupational History   Not on file  Tobacco Use   Smoking status: Former   Smokeless tobacco: Never  Substance and Sexual Activity   Alcohol use: No    Alcohol/week: 0.0 standard drinks of alcohol   Drug use: No   Sexual activity: Not on file  Other Topics Concern   Not on file  Social History Narrative   Not on file   Social Determinants of Health   Financial Resource Strain: Not on file  Food Insecurity: Not on file  Transportation Needs: Not on file  Physical Activity: Not on file  Stress: Not on file  Social Connections: Not on file  Intimate Partner Violence: Not on file     PHYSICAL EXAM  There were no vitals filed for this visit.   There is no height or weight on file to calculate BMI.   General: The patient is well-developed and well-nourished and in no acute distress  Musculoskeletal: She is tender over  L4-L5 midline   Neurologic Exam  Mental status: The patient is alert and oriented x 3 at the time of the examination. The patient has apparent normal recent and remote memory, with an apparently normal attention span and concentration ability.   Speech is normal.  Cranial nerves: She noted mild diplopia but I could not see a dysconjugate gaze looking up to the right.  There is no nystagmus.  Visual acuity is symmetric. Facial strength and sensation are normal. Trapezius strength is normal..   No obvious hearing deficits are noted.  Motor:  Muscle bulk is normal.  She has mildly increased muscle tone in the legs, left greater than right.  . Strength is  5 / 5 in the arms but 4+/5 in the left toes and ankles  Sensory:   She has normal sensation to touch and vibration.  Coordination: Cerebellar testing reveals good finger-nose-finger and heel-to-shin bilaterally.  Gait and station: Station is normal.   The gait is mildly spastic, left greater than right.  There is a mild left foot drop and the gait is mildly wide.  The tandem gait is wide.  Romberg is negative.  Reflexes: Deep tendon reflexes are symmetric and brisk bilaterally.         ASSESSMENT AND PLAN  Multiple sclerosis (HCC)  High risk medication use  Epilepsy, generalized, convulsive (HCC)  Gait disturbance  Left foot drop  Other fatigue    1. Continue Tysabri.    Check JCV and CBC today and every 6 months  2.  We will continue Keppra and Lamictal (brand necessary as she had a severe breakthrough seizure while on generic (status epilepticus requiring intubation).   3.  Continue modafinil for fatigue.  She may have done slightly better with phentermine and that could be reconsidered in the future. 4.   Can take oxybutynin daily 5   Left AFO brace order ([facility] in [place]) 6.  She will return to see me in 6 months will sooner if she has new or worsening neurologic symptoms.   Almee Pelphrey A. Epimenio Foot, MD, PhD 09/18/2022, 8:30  AM Certified in Neurology, Clinical Neurophysiology, Sleep Medicine, Pain Medicine and Neuroimaging  [facility] [address], [address] [place], [place] [address] [phone]   

## 2022-09-24 ENCOUNTER — Telehealth: Payer: Self-pay | Admitting: Neurology

## 2022-09-24 ENCOUNTER — Other Ambulatory Visit: Payer: Self-pay | Admitting: *Deleted

## 2022-09-24 DIAGNOSIS — G40309 Generalized idiopathic epilepsy and epileptic syndromes, not intractable, without status epilepticus: Secondary | ICD-10-CM

## 2022-09-24 MED ORDER — KEPPRA 750 MG PO TABS: 1500.0000 mg | ORAL_TABLET | Freq: Two times a day (BID) | ORAL | 1 refills | Status: DC

## 2022-09-24 NOTE — Telephone Encounter (Signed)
Follow up scheduled on 03/19/23 

## 2022-09-24 NOTE — Telephone Encounter (Signed)
Patient wants to know if Dr. Felecia Shelling can refill Diflenac Sodium 75 mg 1 tabl twice a day as well. This is usually filled by primary so she wants everything under Dr. Felecia Shelling. Thank you

## 2022-09-24 NOTE — Telephone Encounter (Signed)
Dr.Sater I called and spoke with patient she wanted to know if you would be willing to take over her Rx for  Diclofenac sodium 75 mg tablet? Reports she use to get it from PCP, but would prefer for you take over Rx. ( You fill her other medications).   She reports taking 1 tablet by mouth twice daily for pain.   Please advise

## 2022-09-24 NOTE — Telephone Encounter (Signed)
Follow up scheduled on 03/19/23

## 2022-09-24 NOTE — Telephone Encounter (Signed)
Left detailed message on patient voicemail per DPR access. I asked her to call office back to let me know which medication she would prefer to be on.

## 2022-09-27 NOTE — Telephone Encounter (Signed)
Late entry  Left message for patient to call again on 09/26/22.

## 2022-10-01 ENCOUNTER — Telehealth: Payer: Self-pay | Admitting: Neurology

## 2022-10-01 ENCOUNTER — Encounter: Payer: Self-pay | Admitting: Neurology

## 2022-10-01 MED ORDER — AMPHETAMINE-DEXTROAMPHET ER 25 MG PO CP24: 25.0000 mg | ORAL_CAPSULE | ORAL | 0 refills | Status: DC

## 2022-10-01 NOTE — Telephone Encounter (Signed)
Called pt back. She just needs refill on medication. Pt last seen 09/18/22 and next f/u 03/19/23. She would like [facility]:  [id number] [place] [place] - [address]   Per drug registry, last refilled 08/28/22 #30.   States she also was given generic keppra, should have been given brand. I checked and rx sent was for brand. She will f/u on pharmacy on this.

## 2022-10-01 NOTE — Telephone Encounter (Signed)
Pt is calling. Stated she needs to talk to nurse about amphetamine-dextroamphetamine (ADDERALL XR) 25 MG 24 hr capsule. Stated nurse needs to take care of this. Pt is requesting a call-back.

## 2022-10-01 NOTE — Addendum Note (Signed)
Addended by: Britt Bottom on: 10/01/2022 12:58 PM   Modules accepted: Orders

## 2022-10-01 NOTE — Addendum Note (Signed)
Addended by: Wyvonnia Lora on: 10/01/2022 11:16 AM   Modules accepted: Orders

## 2022-10-04 ENCOUNTER — Telehealth: Payer: Self-pay | Admitting: Neurology

## 2022-10-04 NOTE — Telephone Encounter (Signed)
Called pt back. Informed her of the message nurse [facility] sent. Pt said she will call them and schedule an appointment.

## 2022-10-04 NOTE — Telephone Encounter (Signed)
Please call pt back. [employer] got bought out by [employer]. [facility] has her order. She can call them at [phone]. I spoke with them recently and they confirmed her order got forwarded to [facility]

## 2022-10-04 NOTE — Telephone Encounter (Signed)
Pt is calling. Stated she needs a order sent to [facility] in [place] for her drop-foot.

## 2022-11-05 ENCOUNTER — Other Ambulatory Visit: Payer: Self-pay | Admitting: Neurology

## 2022-11-05 MED ORDER — AMPHETAMINE-DEXTROAMPHET ER 25 MG PO CP24: 25.0000 mg | ORAL_CAPSULE | ORAL | 0 refills | Status: DC

## 2022-11-05 NOTE — Addendum Note (Signed)
Addended by: Wyvonnia Lora on: 11/05/2022 09:06 AM   Modules accepted: Orders

## 2022-11-05 NOTE — Telephone Encounter (Signed)
Spoke w/ Selinda Eon who spoke with pt who is waiting in lobby waiting to go back for Tysbari infusion.  Pt needs refill on adderall. She was last seen 09/18/22 and next f/u 03/19/23. Per drug registry, she last refilled 10/01/22 #30.  She also thought she may need refills for Keppra. I checked and [facility]ills [facility] be available [id number] [place], [place] - [id number] Michele informed pt.

## 2022-11-05 NOTE — Telephone Encounter (Signed)
Please call patient in regards to medication.

## 2022-12-03 ENCOUNTER — Other Ambulatory Visit: Payer: Self-pay | Admitting: Physician Assistant

## 2022-12-03 MED ORDER — AMPHETAMINE-DEXTROAMPHET ER 25 MG PO CP24: 25.0000 mg | ORAL_CAPSULE | ORAL | 0 refills | Status: DC

## 2022-12-03 NOTE — Addendum Note (Signed)
Addended by: Arther Abbott on: 12/03/2022 08:29 AM   Modules accepted: Orders

## 2022-12-03 NOTE — Telephone Encounter (Signed)
[facility] request [facility] amphetamine-dextroamphetamine (ADDERALL XR) 25 MG 24 hr capsule

## 2022-12-03 NOTE — Telephone Encounter (Signed)
Pt last seen 09/18/22 and next f/u 03/19/23. Per drug registry, last refilled 11/05/22 #30.

## 2022-12-29 ENCOUNTER — Other Ambulatory Visit: Payer: Self-pay | Admitting: Neurology

## 2022-12-29 DIAGNOSIS — G40309 Generalized idiopathic epilepsy and epileptic syndromes, not intractable, without status epilepticus: Secondary | ICD-10-CM

## 2022-12-30 ENCOUNTER — Other Ambulatory Visit: Payer: Self-pay | Admitting: Neurology

## 2022-12-30 DIAGNOSIS — G40309 Generalized idiopathic epilepsy and epileptic syndromes, not intractable, without status epilepticus: Secondary | ICD-10-CM

## 2022-12-31 ENCOUNTER — Other Ambulatory Visit: Payer: Self-pay | Admitting: Neurology

## 2022-12-31 MED ORDER — AMPHETAMINE-DEXTROAMPHET ER 25 MG PO CP24: 25.0000 mg | ORAL_CAPSULE | ORAL | 0 refills | Status: DC

## 2022-12-31 NOTE — Telephone Encounter (Signed)
Last seen 09/18/22 per note "We will continue Keppra and Lamictal (brand necessary as she had a severe breakthrough seizure while on generic (status epilepticus requiring intubation)."     Follow up scheduled on 03/19/23  Ibuprofen last filled on 12/11/22 #270 tablets (90 day supply) Lamictal last filled on 11/12/22 # 180 tablets (90 day supply)

## 2022-12-31 NOTE — Telephone Encounter (Signed)
Last seen 09/18/22 and next f/u 03/19/23. Per drug registry, last refilled 12/03/22 #30.

## 2022-12-31 NOTE — Telephone Encounter (Signed)
Pt requesting refill of amphetamine-dextroamphetamine (ADDERALL XR) 25 MG 24 hr  at  [id number] [place] [place]

## 2023-01-01 ENCOUNTER — Other Ambulatory Visit: Payer: Self-pay | Admitting: Neurology

## 2023-01-01 ENCOUNTER — Telehealth: Payer: Self-pay | Admitting: *Deleted

## 2023-01-01 DIAGNOSIS — G40309 Generalized idiopathic epilepsy and epileptic syndromes, not intractable, without status epilepticus: Secondary | ICD-10-CM

## 2023-01-01 NOTE — Telephone Encounter (Signed)
   Prior Auth team please see per note "  She has no recent seizures.   She is on brand Keppra and brand Lamictal   She feels diplopia was worse with generic lamotrigine and we tried Keppra plus Vimpat and Keppra plus zonisamide but she had more trouble tolerating those combinations. "

## 2023-01-01 NOTE — Telephone Encounter (Signed)
Per pharmacy: PA needed.

## 2023-01-02 ENCOUNTER — Other Ambulatory Visit: Payer: Self-pay

## 2023-01-02 ENCOUNTER — Telehealth: Payer: Self-pay

## 2023-01-02 ENCOUNTER — Other Ambulatory Visit (HOSPITAL_COMMUNITY): Payer: Self-pay

## 2023-01-02 NOTE — Telephone Encounter (Signed)
Pharmacy Patient Advocate Encounter   Received notification from [employer] that prior authorization for Keppra 750 MG Tablet is required/requested.   PA submitted on 01/02/2023 to (ins) [facility] via [facility]MyMeds [facility] or [facility]) confirmation # [id number] Status is pending

## 2023-01-02 NOTE — Telephone Encounter (Signed)
PA has been submitted, see additional encounter created

## 2023-01-02 NOTE — Telephone Encounter (Signed)
A new telephone call encounter has been made for this PA Request-please see telephone note dated 01/02/2023. 

## 2023-01-03 NOTE — Telephone Encounter (Signed)
See additional encounter created for updates  

## 2023-01-08 NOTE — Telephone Encounter (Signed)
Pharmacy Patient Advocate Encounter  Received notification from [facility] that the request for prior authorization for Keppra 750MG  tablets has been denied due to see below.        Please be advised we currently do not have a Pharmacist to review denials, therefore you will need to process appeals accordingly as needed. Thanks for your support at this time.   You may call  or fax , to appeal.  The denial letter has been placed in the chart-there is also the option to submit and eappeal via [facility].

## 2023-01-09 ENCOUNTER — Other Ambulatory Visit (HOSPITAL_COMMUNITY): Payer: Self-pay

## 2023-01-09 NOTE — Telephone Encounter (Signed)
Appeal sent  via cover my meds. Pending response.

## 2023-01-09 NOTE — Telephone Encounter (Signed)
Patient Advocate Encounter  Appeal for KEPPRA 750MG  has been approved with [facility].    PA# [id number] Effective dates: 5.1.24 through 5.15.25  Per [facility] test claim, copay for 30 days supply is $60

## 2023-01-22 ENCOUNTER — Telehealth: Payer: Self-pay | Admitting: Neurology

## 2023-01-22 NOTE — Telephone Encounter (Signed)
Pt requesting refill of  KEPPRA 750 MG tablet [phone]

## 2023-01-22 NOTE — Telephone Encounter (Signed)
Last refill sent to this pharmacy 09/24/22 #360 and 1 refill. Pharmacy should have refill on file.   Phone room: please call pt to let her know

## 2023-01-23 ENCOUNTER — Telehealth: Payer: Self-pay | Admitting: *Deleted

## 2023-01-23 ENCOUNTER — Other Ambulatory Visit (HOSPITAL_COMMUNITY): Payer: Self-pay

## 2023-01-23 ENCOUNTER — Other Ambulatory Visit: Payer: Self-pay | Admitting: Neurology

## 2023-01-23 ENCOUNTER — Other Ambulatory Visit: Payer: Self-pay

## 2023-01-23 ENCOUNTER — Telehealth: Payer: Self-pay

## 2023-01-23 DIAGNOSIS — G40309 Generalized idiopathic epilepsy and epileptic syndromes, not intractable, without status epilepticus: Secondary | ICD-10-CM

## 2023-01-23 NOTE — Telephone Encounter (Signed)
Called pt. Informed her of message nurse [facility] sent. Pt said the pharmacy is telling me there are no more refills on file. Stated she would call the pharmacy back and then call the office back.

## 2023-01-23 NOTE — Telephone Encounter (Signed)
Message sent to PA team

## 2023-01-23 NOTE — Telephone Encounter (Signed)
A new telephone call encounter has been made for this PA Request-please see telephone note dated ...01/23/2023 

## 2023-01-23 NOTE — Telephone Encounter (Signed)
   PA needed. Please route replies to POD 1

## 2023-01-23 NOTE — Telephone Encounter (Signed)
[phone]. Spoke w/ tech. Working on prescription now for qty 360, had to order medication. Should be in tomorrow.  Called pt and provided update.  She verbalized understanding.

## 2023-01-23 NOTE — Telephone Encounter (Signed)
Pharmacy Patient Advocate Encounter   Received notification from [employer] that prior authorization for LaMICtal 200MG  tablets is required/requested.   PA submitted on 01/23/2023 to (ins) [facility] via CoverMyMeds Key or (Medicaid) confirmation # [id number] Status is pending

## 2023-01-29 ENCOUNTER — Other Ambulatory Visit: Payer: Self-pay

## 2023-01-29 ENCOUNTER — Other Ambulatory Visit: Payer: Self-pay | Admitting: *Deleted

## 2023-01-29 ENCOUNTER — Telehealth: Payer: Self-pay | Admitting: *Deleted

## 2023-01-29 ENCOUNTER — Other Ambulatory Visit: Payer: Self-pay | Admitting: Neurology

## 2023-01-29 DIAGNOSIS — G35 Multiple sclerosis: Secondary | ICD-10-CM

## 2023-01-29 DIAGNOSIS — Z79899 Other long term (current) drug therapy: Secondary | ICD-10-CM

## 2023-01-29 MED ORDER — AMPHETAMINE-DEXTROAMPHET ER 25 MG PO CP24: 25.0000 mg | ORAL_CAPSULE | ORAL | 0 refills | Status: DC

## 2023-01-29 NOTE — Telephone Encounter (Signed)
Please refill - 2 meds  amphetamine-dextroamphetamine (ADDERALL XR) 25 MG 24 hr capsule   LAMICTAL 200 MG tablet

## 2023-01-29 NOTE — Telephone Encounter (Signed)
Placed JCV lab in [facility] lock box for routine lab pick up. Results pending. 

## 2023-01-29 NOTE — Addendum Note (Signed)
Addended by: Arther Abbott on: 01/29/2023 07:50 AM   Modules accepted: Orders

## 2023-01-29 NOTE — Telephone Encounter (Signed)
Pt last seen 09/18/22 and next f/u 03/19/23.   Last refilled adderall XR 25mg  12/31/22 #30. Sent refill request to MD to e-scribe  Last refilled Lamictal 11/12/22 #180. Reviewed chart and rx refill already sent in on 01/23/23 #180, 1 refill to:  [id number] - [place], [place] - [address]

## 2023-01-30 LAB — CBC WITH DIFFERENTIAL/PLATELET
Basophils Absolute: 0.1 10*3/uL (ref 0.0–0.2)
Basos: 1 %
EOS (ABSOLUTE): 0.3 10*3/uL (ref 0.0–0.4)
Eos: 5 %
Hematocrit: 34.1 % (ref 34.0–46.6)
Hemoglobin: 11.2 g/dL (ref 11.1–15.9)
Immature Grans (Abs): 0 10*3/uL (ref 0.0–0.1)
Immature Granulocytes: 0 %
Lymphocytes Absolute: 2.2 10*3/uL (ref 0.7–3.1)
Lymphs: 47 %
MCH: 29.5 pg (ref 26.6–33.0)
MCHC: 32.8 g/dL (ref 31.5–35.7)
MCV: 90 fL (ref 79–97)
Monocytes Absolute: 0.4 10*3/uL (ref 0.1–0.9)
Monocytes: 7 %
Neutrophils Absolute: 1.9 10*3/uL (ref 1.4–7.0)
Neutrophils: 40 %
Platelets: 209 10*3/uL (ref 150–450)
RBC: 3.8 x10E6/uL (ref 3.77–5.28)
RDW: 13.3 % (ref 11.7–15.4)
WBC: 4.7 10*3/uL (ref 3.4–10.8)

## 2023-01-31 NOTE — Telephone Encounter (Signed)
The Eappeal was denied-so I completed an appeal letter and faxed it along with clinicals to [phone]. Will update once we receive a response form the faxed letter.

## 2023-01-31 NOTE — Telephone Encounter (Signed)
Pharmacy Patient Advocate Encounter  Received notification from [employer] that the request for prior authorization for LaMICtal 200MG  tablets has been denied due to see below.     Because the insurance failed to review the criteria sent to them and denied the claim I went ahead and submitted an Eappeal on CMM. Will update once we hear back.

## 2023-02-12 ENCOUNTER — Encounter: Payer: Self-pay | Admitting: Neurology

## 2023-02-26 ENCOUNTER — Other Ambulatory Visit: Payer: Self-pay | Admitting: Neurology

## 2023-02-26 ENCOUNTER — Other Ambulatory Visit: Payer: Self-pay

## 2023-02-26 MED ORDER — AMPHETAMINE-DEXTROAMPHET ER 25 MG PO CP24: 25.0000 mg | ORAL_CAPSULE | ORAL | 0 refills | Status: DC

## 2023-02-26 NOTE — Addendum Note (Signed)
Addended by: Leandra Kern R on: 02/26/2023 07:50 AM   Modules accepted: Orders

## 2023-02-26 NOTE — Telephone Encounter (Signed)
Last Seen 09/18/2022 Upcoming Appointment 03/19/2023  Adderal 25mg  Last Filled 01/29/2023 Escript 02/26/2023

## 2023-02-26 NOTE — Telephone Encounter (Signed)
Patient is requesting refill on ADDERAL 25mg 

## 2023-03-19 ENCOUNTER — Ambulatory Visit: Payer: BC Managed Care – PPO | Admitting: Neurology

## 2023-03-19 ENCOUNTER — Encounter: Payer: Self-pay | Admitting: Neurology

## 2023-03-19 VITALS — BP 115/78 | HR 100 | Ht 68.0 in | Wt 163.6 lb

## 2023-03-19 DIAGNOSIS — G40309 Generalized idiopathic epilepsy and epileptic syndromes, not intractable, without status epilepticus: Secondary | ICD-10-CM

## 2023-03-19 DIAGNOSIS — Z2989 Encounter for other specified prophylactic measures: Secondary | ICD-10-CM

## 2023-03-19 DIAGNOSIS — R269 Unspecified abnormalities of gait and mobility: Secondary | ICD-10-CM

## 2023-03-19 DIAGNOSIS — G35 Multiple sclerosis: Secondary | ICD-10-CM | POA: Diagnosis not present

## 2023-03-19 DIAGNOSIS — M21372 Foot drop, left foot: Secondary | ICD-10-CM

## 2023-03-19 DIAGNOSIS — R5383 Other fatigue: Secondary | ICD-10-CM

## 2023-03-19 NOTE — Progress Notes (Signed)
[facility]  PATIENT: Ruth Anderson DOB: 1971-07-22  REFERRING DOCTOR OR PCP:  none SOURCE: patient, husband and records from [facility] neurology and images from [facility].  _________________________________   HISTORICAL  CHIEF COMPLAINT:  Chief Complaint  Patient presents with   Follow-up    Pt in room 10. Here for MS follow up. Pt reports this past weekend she was tired, but otherwise doing okay. Pt reports last fall was yesterday. Pt reports no concerns.     HISTORY OF PRESENT ILLNESS:  Ruth Anderson is a 52 year old woman with relapsing remitting multiple sclerosis and epilepsy.     Update 03/19/2023 Her MS is stable and she has no exacerbarion or new symptom..  She is on Tysabri and tolerates it well.   She has been JCV  negative.     (0.15 on 01/29/2023)  She is noting more fatigue.  This is worse on a busier day or if hot.  She notes reduced focus/attention/verbal fluency/processing.   Fatigue and cognitive issues improved do better with Adderall and modafinil with benefit . She sleeps well most nights.        She has a reduced gait and balance.    Left foot drop is mild nitially and worsens as she goes longer distance.   She no longer has a brace.    She falls some - usually tripping on uneven surfaces, especially outside.    She holds the bannister on stairs.      Visual acuity either eye is fine with glasses and she has no recent diplopia.    She has bladder urgency and nocturia but stopped oxybutynin since she is better.  She has nocturia 2 times nightly but quickly falls back asleep..     She has no recent seizures.   She is on brand Keppra and brand Lamictal   She feels diplopia was worse with generic lamotrigine and we tried Keppra plus Vimpat and Keppra plus zonisamide but she had more trouble tolerating those combinations.   She notes mild depression - she has more stress with husband having recent orthopedic surgery and requiring her  help.     SHe has some stress with sister's death recently.         She also has to take care of her grand-daughter   MS History:  She was diagnosed with MS in 1988 placed on Betaseron when it became available.  Later on she switched to Copaxone and Avonex. When Tysabri became available in 2006, she switched and had 70 infusions over the next 6  years (monthly except for a 6 month holiday).   She tolerated Tysabri well, was stable and was JCV antibody negative. However, she did not like having to do the monthly to hour infusions. In May 2013, she started Tecfidera. She had flushing that persisted after many months but did not have significant GI issues.  She went back to [place] in 2016.   Vitamin D was 32.9 in July 2015. Lymphocyte count was 0.8 on 07/29/2014.  Seizure History:   Her first seizure was in 1998 and they have been triggered by flashing Christmas tree lights. She has had about a dozen seizures overall with most of them occurring out of sleep but with a couple of them occurring during the daytime with severe ictal grogginess. For the most part, the seizures have been controlled on Lamictal 200 mg 3 times a day and Keppra 1500 mg twice a day.    Of note, she  had breakthrough seizure on generic so we have been writing for the brand.     Her last seizure was in 2014, shortly after her sister died.  She needed intubation.    Imaging: MRI of the brain 12/03/2019 showed no new lesions compared to the MRI from 09/23/2018:    MRI of the brain 09/23/2018 showed no new lesions compared to the MRI 11/02/2015.    MRI of the brain 11/02/2015 shows T2/FLAIR hyperintense foci in the cerebellum, brainstem and cerebral hemispheres in a pattern and configuration consistent with chronic demyelinating plaque associated with multiple sclerosis.    There is a normal enhancement pattern.. None of the foci appeared to be acute.    The extent of demyelinating foci appears essentially unchanged since  11/25/2012.    REVIEW OF SYSTEMS: Constitutional: No fevers, chills, sweats, or change in appetite.  Notes some fatigue Eyes: as above.  No eye pain Ear, nose and throat: No hearing loss, ear pain, nasal congestion, sore throat Cardiovascular: No chest pain, palpitations Respiratory:  No shortness of breath at rest or with exertion.   No wheezes GastrointestinaI: No nausea, vomiting, diarrhea, abdominal pain, fecal incontinence Genitourinary:  No dysuria, urinary retention or frequency.  No nocturia. Musculoskeletal:  No neck pain, back pain Integumentary: No rash, pruritus, skin lesions Neurological: as above Psychiatric: No depression at this time.  No anxiety Endocrine: No palpitations, diaphoresis, change in appetite, change in weigh or increased thirst Hematologic/Lymphatic:  No anemia, purpura, petechiae. Allergic/Immunologic: No itchy/runny eyes, nasal congestion, recent allergic reactions, rashes  ALLERGIES: Allergies  Allergen Reactions   Zonisamide     "made me loopy"    HOME MEDICATIONS:  Current Outpatient Medications:    amphetamine-dextroamphetamine (ADDERALL XR) 25 MG 24 hr capsule, Take 1 capsule by mouth every morning., Disp: 30 capsule, Rfl: 0   ibuprofen (ADVIL) 800 MG tablet, TAKE 1 TABLET BY MOUTH 3 TIMES DAILY WITH FOOD AS NEEDED FOR PAIN & INFLAMMATION, Disp: 270 tablet, Rfl: 0   KEPPRA 750 MG tablet, Take 2 tablets (1,500 mg total) by mouth 2 (two) times daily., Disp: 360 tablet, Rfl: 1   LAMICTAL 200 MG tablet, TAKE 1 TABLET BY MOUTH TWO TIMES DAILY. BRAND NAME LAMICTAL FOR SEIZURE DISORDER, Disp: 180 tablet, Rfl: 1   natalizumab (TYSABRI) 300 MG/15ML injection, Inject into the vein., Disp: , Rfl:    VITAMIN D PO, Take 5,000 Units by mouth daily., Disp: , Rfl:  No current facility-administered medications for this visit.  Facility-Administered Medications Ordered in Other Visits:    gadopentetate dimeglumine (MAGNEVIST) injection 17 mL, 17 mL,  Intravenous, Once PRN, Syniah Berne, Pearletha Furl, MD  PAST MEDICAL HISTORY: Past Medical History:  Diagnosis Date   Multiple sclerosis (HCC)    Seizures (HCC)    Vision abnormalities     PAST SURGICAL HISTORY: Past Surgical History:  Procedure Laterality Date   IR IMAGING GUIDED PORT INSERTION  09/14/2019   IR REMOVAL TUN ACCESS W/ PORT W/O FL MOD SED  09/14/2019    FAMILY HISTORY: Family History  Problem Relation Age of Onset   Hypertension Mother    Heart disease Father    Hypertension Father    Stroke Father    Diabetes type II Father     SOCIAL HISTORY:  Social History   Socioeconomic History   Marital status: Married    Spouse name: Not on file   Number of children: Not on file   Years of education: Not on file   Highest education  level: Not on file  Occupational History   Not on file  Tobacco Use   Smoking status: Former   Smokeless tobacco: Never  Substance and Sexual Activity   Alcohol use: No    Alcohol/week: 0.0 standard drinks of alcohol   Drug use: No   Sexual activity: Not on file  Other Topics Concern   Not on file  Social History Narrative   Not on file   Social Determinants of Health   Financial Resource Strain: Not on file  Food Insecurity: Not on file  Transportation Needs: Not on file  Physical Activity: Not on file  Stress: Not on file  Social Connections: Not on file  Intimate Partner Violence: Not on file     PHYSICAL EXAM  Vitals:   03/19/23 0804  BP: 115/78  Pulse: 100  Weight: 163 lb 9.6 oz (74.2 kg)  Height: 5\' 8"  (1.727 m)     Body mass index is 24.88 kg/m.   General: The patient is well-developed and well-nourished and in no acute distress  Musculoskeletal: She is tender over L4-L5 midline   Neurologic Exam  Mental status: The patient is alert and oriented x 3 at the time of the examination. The patient has apparent normal recent and remote memory, with an apparently normal attention span and concentration ability.    Speech is normal.  Cranial nerves: She noted mild diplopia but I could not see a dysconjugate gaze looking up to the right.  There is no nystagmus.  Visual acuity is symmetric. Facial strength and sensation are normal. Trapezius strength is normal..   No obvious hearing deficits are noted.  Motor:  Muscle bulk is normal.  She has mildly increased muscle tone in the legs, left greater than right.  . Strength is  5 / 5 in the arms but 4+/5 in the left toes and ankles  Sensory:   She has normal sensation to touch and vibration.  Coordination: Cerebellar testing reveals good finger-nose-finger and heel-to-shin bilaterally.  Gait and station: Station is normal.   The gait is mildly spastic, left greater than right.  She has a left foot drop .  The tandem gait is wide.  Romberg is negative.  Reflexes: Deep tendon reflexes are symmetric and brisk bilaterally.         ASSESSMENT AND PLAN  Multiple sclerosis (HCC) - Plan: MR BRAIN W WO CONTRAST  Encounter for immunotherapy  Gait disturbance - Plan: MR BRAIN W WO CONTRAST  Epilepsy, generalized, convulsive (HCC)  Left foot drop  Other fatigue    1. Continue Tysabri.    Check JCV and CBC every 6 months  (ws fine last month) 2.  We will continue Keppra and Lamictal (brand necessary as she had a severe breakthrough seizure while on generic (status epilepticus requiring intubation).   3.  Continue Adderall for fatigue.   4.   Can take oxybutynin if bladder worsens aain 5   Left AFO brace order ([facility] in [place]) 6.  She will return to see me in 6 months will sooner if she has new or worsening neurologic symptoms.   Makeshia Seat A. Epimenio Foot, MD, PhD 03/19/2023, 9:01 AM Certified in Neurology, Clinical Neurophysiology, Sleep Medicine, Pain Medicine and Neuroimaging  [facility] [address] [place], [place] [address] [phone]

## 2023-03-26 ENCOUNTER — Other Ambulatory Visit: Payer: Self-pay | Admitting: Neurology

## 2023-03-26 ENCOUNTER — Ambulatory Visit (INDEPENDENT_AMBULATORY_CARE_PROVIDER_SITE_OTHER): Payer: BC Managed Care – PPO

## 2023-03-26 DIAGNOSIS — R269 Unspecified abnormalities of gait and mobility: Secondary | ICD-10-CM | POA: Diagnosis not present

## 2023-03-26 DIAGNOSIS — G35 Multiple sclerosis: Secondary | ICD-10-CM

## 2023-03-26 MED ORDER — AMPHETAMINE-DEXTROAMPHET ER 25 MG PO CP24: 25.0000 mg | ORAL_CAPSULE | ORAL | 0 refills | Status: DC

## 2023-03-26 MED ORDER — GADOBENATE DIMEGLUMINE 529 MG/ML IV SOLN
15.0000 mL | Freq: Once | INTRAVENOUS | Status: AC | PRN
  Administered 2023-03-26: 15 mL via INTRAVENOUS

## 2023-03-26 NOTE — Telephone Encounter (Signed)
Pt requesting refill for amphetamine-dextroamphetamine (ADDERALL XR) 25 MG 24 hr capsule  at  [facility] [phone]

## 2023-03-26 NOTE — Telephone Encounter (Signed)
Pt last seen on 03/19/23 Follow up scheduled on 09/23/22 Rx last prescribed on 02/27/23 #30 tablets (30 day supply) Rx pending to be signed

## 2023-04-01 ENCOUNTER — Telehealth: Payer: Self-pay | Admitting: Neurology

## 2023-04-01 NOTE — Telephone Encounter (Signed)
Pt asking for Dr. Epimenio Foot to write a letter to be excused from jury duty for do not remember things very well. Would like a call back when letter is ready.

## 2023-04-02 ENCOUNTER — Encounter: Payer: Self-pay | Admitting: Neurology

## 2023-04-16 ENCOUNTER — Telehealth: Payer: Self-pay | Admitting: Neurology

## 2023-04-16 ENCOUNTER — Other Ambulatory Visit: Payer: Self-pay | Admitting: Neurology

## 2023-04-16 MED ORDER — PREDNISONE 20 MG PO TABS: ORAL_TABLET | ORAL | 0 refills | Status: AC

## 2023-04-16 NOTE — Telephone Encounter (Signed)
Called and spoke to pt and relayed the msg that the medication was sent. She voiced gratitude & understanding

## 2023-04-16 NOTE — Telephone Encounter (Signed)
Pt called wanting to know if some Prednisone can be called in for her. Pt would not specify why. Please advise.

## 2023-04-16 NOTE — Telephone Encounter (Signed)
Dr.Sater called asking if a Rx could be sent for prednisone, pt said since last Wednesday she has been feeling weak and having trouble getting out of the bed. Pt said her husband has to help her get out of the bed, she still taking Keppra, Lamictal, Adderall,  Next Tysburi infusion due next week. She reports no vision problems, no recent infection.  Pt reports she had prednisone in past and done well with Rx.    Please advise

## 2023-04-21 ENCOUNTER — Other Ambulatory Visit: Payer: Self-pay | Admitting: Neurology

## 2023-04-21 DIAGNOSIS — G40309 Generalized idiopathic epilepsy and epileptic syndromes, not intractable, without status epilepticus: Secondary | ICD-10-CM

## 2023-04-22 MED ORDER — KEPPRA 750 MG PO TABS
1500.0000 mg | ORAL_TABLET | Freq: Two times a day (BID) | ORAL | 1 refills | Status: DC
Start: 2023-04-22 — End: 2023-10-17

## 2023-04-23 ENCOUNTER — Telehealth: Payer: Self-pay | Admitting: Neurology

## 2023-04-23 NOTE — Telephone Encounter (Signed)
Pt in office requesting refill of    amphetamine-dextroamphetamine (ADDERALL XR) 25 MG 24 hr capsule at [id number] .

## 2023-04-23 NOTE — Telephone Encounter (Signed)
Phone please call  Rx was just filed and it appears it was dispensed on 04/01/23 [facility]

## 2023-05-06 ENCOUNTER — Other Ambulatory Visit: Payer: Self-pay | Admitting: Neurology

## 2023-05-07 ENCOUNTER — Other Ambulatory Visit: Payer: Self-pay | Admitting: Neurology

## 2023-05-07 MED ORDER — AMPHETAMINE-DEXTROAMPHET ER 25 MG PO CP24: 25.0000 mg | ORAL_CAPSULE | ORAL | 0 refills | Status: DC

## 2023-05-07 NOTE — Telephone Encounter (Signed)
Last seen 03-19-2023. Filled 04-01-2023 #30 per [facility].  Next appt 09-24-2023.

## 2023-05-07 NOTE — Telephone Encounter (Signed)
Pt request refill for amphetamine-dextroamphetamine (ADDERALL XR) 25 MG 24 hr capsule [facility] [phone]

## 2023-05-07 NOTE — Telephone Encounter (Signed)
Rx was just sent on 04/22/23

## 2023-06-05 ENCOUNTER — Other Ambulatory Visit: Payer: Self-pay | Admitting: Neurology

## 2023-06-05 MED ORDER — AMPHETAMINE-DEXTROAMPHET ER 25 MG PO CP24: 25.0000 mg | ORAL_CAPSULE | ORAL | 0 refills | Status: DC

## 2023-06-05 NOTE — Telephone Encounter (Signed)
Last seen 03/19/23 and next f/u 09/24/23. Last refilled 05/07/23 #30.

## 2023-06-05 NOTE — Telephone Encounter (Signed)
Pt in office requesting refill of    amphetamine-dextroamphetamine (ADDERALL XR) 25 MG 24 hr capsule at [id number] .

## 2023-06-09 ENCOUNTER — Other Ambulatory Visit: Payer: Self-pay | Admitting: Neurology

## 2023-06-10 NOTE — Telephone Encounter (Signed)
Last seen on 03/19/23 Follow up scheduled on 09/24/23

## 2023-07-03 ENCOUNTER — Other Ambulatory Visit: Payer: Self-pay | Admitting: *Deleted

## 2023-07-03 ENCOUNTER — Other Ambulatory Visit (HOSPITAL_COMMUNITY): Payer: Self-pay | Admitting: Neurology

## 2023-07-03 ENCOUNTER — Telehealth: Payer: Self-pay | Admitting: Neurology

## 2023-07-03 DIAGNOSIS — Z95828 Presence of other vascular implants and grafts: Secondary | ICD-10-CM

## 2023-07-03 DIAGNOSIS — Z452 Encounter for adjustment and management of vascular access device: Secondary | ICD-10-CM

## 2023-07-03 NOTE — Progress Notes (Signed)
Called pt. Informed her that we are sending order for her port to be assessed at [facility]. Once referral sent, someone will call her to schedule. Asked her to call back if she has any difficulty in getting scheduled.

## 2023-07-03 NOTE — Telephone Encounter (Signed)
Referral for port a cath sent to [facility] [phone]

## 2023-07-09 ENCOUNTER — Other Ambulatory Visit: Payer: Self-pay | Admitting: Radiology

## 2023-07-10 ENCOUNTER — Ambulatory Visit (HOSPITAL_COMMUNITY)
Admission: RE | Admit: 2023-07-10 | Discharge: 2023-07-10 | Disposition: A | Discharge status: Home / Self Care | Payer: BC Managed Care – PPO | Source: Ambulatory Visit | Attending: Neurology | Admitting: Neurology

## 2023-07-10 DIAGNOSIS — Z452 Encounter for adjustment and management of vascular access device: Secondary | ICD-10-CM | POA: Insufficient documentation

## 2023-07-10 DIAGNOSIS — Z95828 Presence of other vascular implants and grafts: Secondary | ICD-10-CM

## 2023-07-10 HISTORY — PX: IR CV LINE INJECTION: IMG2294

## 2023-07-10 MED ORDER — IOHEXOL 300 MG/ML  SOLN
50.0000 mL | Freq: Once | INTRAMUSCULAR | Status: AC | PRN
  Administered 2023-07-10: 7 mL via INTRAVENOUS

## 2023-07-10 MED ORDER — HEPARIN SOD (PORK) LOCK FLUSH 100 UNIT/ML IV SOLN
INTRAVENOUS | Status: AC
  Filled 2023-07-10: qty 5

## 2023-07-16 ENCOUNTER — Other Ambulatory Visit: Payer: Self-pay | Admitting: Neurology

## 2023-07-16 MED ORDER — AMPHETAMINE-DEXTROAMPHET ER 25 MG PO CP24: 25.0000 mg | ORAL_CAPSULE | ORAL | 0 refills | Status: DC

## 2023-07-16 NOTE — Telephone Encounter (Signed)
Last seen 03/19/23 and next f/u 09/24/23. Last refilled 06/06/23 #20.

## 2023-07-16 NOTE — Telephone Encounter (Signed)
Pt is requesting a refill for amphetamine-dextroamphetamine (ADDERALL XR) 25 MG 24 hr capsule.  [id number]

## 2023-07-16 NOTE — Telephone Encounter (Signed)
Error

## 2023-07-31 ENCOUNTER — Other Ambulatory Visit: Payer: Self-pay

## 2023-07-31 ENCOUNTER — Telehealth: Payer: Self-pay

## 2023-07-31 DIAGNOSIS — G35 Multiple sclerosis: Secondary | ICD-10-CM

## 2023-07-31 NOTE — Telephone Encounter (Signed)
Placed JCV in [facility] box on 07/31/2023

## 2023-08-01 LAB — CBC WITH DIFFERENTIAL/PLATELET
Basophils Absolute: 0.1 10*3/uL (ref 0.0–0.2)
Basos: 1 %
EOS (ABSOLUTE): 0.3 10*3/uL (ref 0.0–0.4)
Eos: 5 %
Hematocrit: 37.4 % (ref 34.0–46.6)
Hemoglobin: 12.5 g/dL (ref 11.1–15.9)
Immature Grans (Abs): 0 10*3/uL (ref 0.0–0.1)
Immature Granulocytes: 0 %
Lymphocytes Absolute: 3.6 10*3/uL — ABNORMAL HIGH (ref 0.7–3.1)
Lymphs: 51 %
MCH: 29.9 pg (ref 26.6–33.0)
MCHC: 33.4 g/dL (ref 31.5–35.7)
MCV: 90 fL (ref 79–97)
Monocytes Absolute: 0.6 10*3/uL (ref 0.1–0.9)
Monocytes: 9 %
Neutrophils Absolute: 2.4 10*3/uL (ref 1.4–7.0)
Neutrophils: 34 %
Platelets: 278 10*3/uL (ref 150–450)
RBC: 4.18 x10E6/uL (ref 3.77–5.28)
RDW: 13 % (ref 11.7–15.4)
WBC: 7.2 10*3/uL (ref 3.4–10.8)

## 2023-08-06 NOTE — Telephone Encounter (Signed)
Called and relayed message below from Dr. Epimenio Foot. Pt verbalized understanding. Aware infusion suite will call her to r/s her next infusion to be 6wk from last infusion. I provided updated order to infusion suite as well.

## 2023-08-19 ENCOUNTER — Other Ambulatory Visit: Payer: Self-pay | Admitting: Neurology

## 2023-08-19 MED ORDER — AMPHETAMINE-DEXTROAMPHET ER 25 MG PO CP24: 25.0000 mg | ORAL_CAPSULE | ORAL | 0 refills | Status: DC

## 2023-08-19 NOTE — Telephone Encounter (Signed)
Requested Prescriptions   Pending Prescriptions Disp Refills   amphetamine-dextroamphetamine (ADDERALL XR) 25 MG 24 hr capsule 30 capsule 0    Sig: Take 1 capsule by mouth every morning.   Last seen 03/19/23 Next seen 09/24/23 Dispenses   Dispensed Days Supply Quantity Provider Pharmacy  DEXTROAMP-AMPHET ER 25 MG CAP 07/16/2023 30 30 each Sater, Pearletha Furl, MD [facility] [phone] - A...  DEXTROAMP-AMPHET ER 25 MG CAP 06/06/2023 20 20 each Sater, Pearletha Furl, MD [facility] [phone] - A...  DEXTROAMP-AMPHET ER 25 MG CAP 05/07/2023 30 30 each Sater, Pearletha Furl, MD [facility] [phone] - A...  DEXTROAMP-AMPHET ER 25 MG CAP 04/01/2023 30 30 each Sater, Pearletha Furl, MD [facility] [phone] - A...  DEXTROAMP-AMPHET ER 25 MG CAP 02/27/2023 30 30 each Sater, Pearletha Furl, MD [facility] [phone] - A...  DEXTROAMP-AMPHET ER 25 MG CAP 01/29/2023 30 30 each Sater, Pearletha Furl, MD [facility] [phone] - A...  DEXTROAMP-AMPHET ER 25 MG CAP 12/31/2022 30 30 each Sater, Pearletha Furl, MD [facility] [phone] - A...  DEXTROAMP-AMPHET ER 25 MG CAP 12/03/2022 30 30 each Sater, Pearletha Furl, MD [facility] [phone] - A...  DEXTROAMP-AMPHET ER 25 MG CAP 11/05/2022 30 30 each Sater, Pearletha Furl, MD [facility] [phone] - A...  DEXTROAMP-AMPHET ER 25 MG CAP 10/01/2022 30 30 each Sater, Pearletha Furl, MD [facility] [phone] - A...  DEXTROAMP-AMPHET ER 25 MG CAP 08/28/2022 30 30 each Penumalli, Glenford Bayley, MD [facility] [phone] - A.Marland KitchenMarland Kitchen

## 2023-08-19 NOTE — Telephone Encounter (Signed)
Pt requesting refill of amphetamine-dextroamphetamine (ADDERALL XR) 25 MG 24 hr capsule  [facility] [phone]

## 2023-09-19 ENCOUNTER — Other Ambulatory Visit: Payer: Self-pay | Admitting: Neurology

## 2023-09-19 MED ORDER — AMPHETAMINE-DEXTROAMPHET ER 25 MG PO CP24: 25.0000 mg | ORAL_CAPSULE | ORAL | 0 refills | Status: DC

## 2023-09-19 NOTE — Telephone Encounter (Signed)
Pt called needing a refill on her amphetamine-dextroamphetamine (ADDERALL XR) 25 MG 24 hr capsule  and [facility] [place].

## 2023-09-19 NOTE — Telephone Encounter (Signed)
Last seen 03/19/23 and next f/u 09/24/23. Last refilled 08/19/23 #30.

## 2023-09-24 ENCOUNTER — Encounter: Payer: Self-pay | Admitting: Neurology

## 2023-09-24 ENCOUNTER — Ambulatory Visit: Payer: BC Managed Care – PPO | Admitting: Neurology

## 2023-09-24 VITALS — BP 134/84 | HR 77 | Ht 68.0 in | Wt 163.5 lb

## 2023-09-24 DIAGNOSIS — G40309 Generalized idiopathic epilepsy and epileptic syndromes, not intractable, without status epilepticus: Secondary | ICD-10-CM

## 2023-09-24 DIAGNOSIS — Z95828 Presence of other vascular implants and grafts: Secondary | ICD-10-CM | POA: Diagnosis not present

## 2023-09-24 DIAGNOSIS — H532 Diplopia: Secondary | ICD-10-CM

## 2023-09-24 DIAGNOSIS — M21372 Foot drop, left foot: Secondary | ICD-10-CM

## 2023-09-24 DIAGNOSIS — Z79899 Other long term (current) drug therapy: Secondary | ICD-10-CM

## 2023-09-24 DIAGNOSIS — G35 Multiple sclerosis: Secondary | ICD-10-CM

## 2023-09-24 DIAGNOSIS — R269 Unspecified abnormalities of gait and mobility: Secondary | ICD-10-CM

## 2023-09-24 DIAGNOSIS — E559 Vitamin D deficiency, unspecified: Secondary | ICD-10-CM

## 2023-09-24 NOTE — Progress Notes (Signed)
[facility]  PATIENT: Ruth Anderson DOB: 08/31/1970  REFERRING DOCTOR OR PCP:  none SOURCE: patient, husband and records from [facility] neurology and images from [facility].  _________________________________   HISTORICAL  CHIEF COMPLAINT:  Chief Complaint  Patient presents with   Room 10    Pt is here Alone. Pt states that she has dizzy spells when ever she bends down.     HISTORY OF PRESENT ILLNESS:  Brian Kocourek is a 53 year old woman with relapsing remitting multiple sclerosis and epilepsy.     Update 09/24/2023 Her MS is stable and she has no exacerbarion or new symptom..  She is on Tysabri and tolerates it well.   She has been JCV  negative.but last reading was low positive     (0.44 on 07/31/2023)  She is noting more fatigue.  This is worse on a busier day or if hot.  She notes reduced focus/attention/verbal fluency/processing.   Fatigue and cognitive issues improved do better with Adderall and modafinil with benefit . She sleeps well most nights.        She has a reduced gait and balance.    Left foot drop is mild initially and worsens as she goes longer distance.   She has a brace but it hurts some so she seldom uses it.  She is going to see if it can be modified    She falls some - usually tripping on uneven surfaces, especially outside.    She holds the bannister on stairs.      Visual acuity either eye is fine with glasses and she has no diplopia looking to the right   She has bladder urgency and nocturia but stopped oxybutynin since she is better.  She has 1-2 x nocturia  She has no recent seizures. Last one was around 2014  She is on brand Keppra and brand Lamictal   She feels diplopia was worse with generic lamotrigine and we tried Keppra plus Vimpat and Keppra plus zonisamide but she had more trouble tolerating those combinations.  She repots mood is doing well  She also has to take care of her grand-daughter age 69 (born addicted to  drugs)   MS History:  She was diagnosed with MS in 1988 placed on Betaseron when it became available.  Later on she switched to Copaxone and Avonex. When Tysabri became available in 2006, she switched and had 70 infusions over the next 6  years (monthly except for a 6 month holiday).   She tolerated Tysabri well, was stable and was JCV antibody negative. However, she did not like having to do the monthly to hour infusions. In May 2013, she started Tecfidera. She had flushing that persisted after many months but did not have significant GI issues.  She went back to [place] in 2016.     Seizure History:   Her first seizure was in 1998 and they have been triggered by flashing Christmas tree lights. She has had about a dozen seizures overall with most of them occurring out of sleep but with a couple of them occurring during the daytime with severe ictal grogginess. For the most part, the seizures have been controlled on Lamictal 200 mg 3 times a day and Keppra 1500 mg twice a day.    Of note, she had breakthrough seizure on generic so we have been writing for the brand.     Her last seizure was in 2014, shortly after her sister died.  She needed intubation.  Imaging: MRI of the brain 12/03/2019 showed no new lesions compared to the MRI from 09/23/2018:    MRI of the brain 09/23/2018 showed no new lesions compared to the MRI 11/02/2015.    MRI of the brain 11/02/2015 shows T2/FLAIR hyperintense foci in the cerebellum, brainstem and cerebral hemispheres in a pattern and configuration consistent with chronic demyelinating plaque associated with multiple sclerosis.    There is a normal enhancement pattern.. None of the foci appeared to be acute.    The extent of demyelinating foci appears essentially unchanged since 11/25/2012.    REVIEW OF SYSTEMS: Constitutional: No fevers, chills, sweats, or change in appetite.  Notes some fatigue Eyes: as above.  No eye pain Ear, nose and throat: No hearing loss, ear pain,  nasal congestion, sore throat Cardiovascular: No chest pain, palpitations Respiratory:  No shortness of breath at rest or with exertion.   No wheezes GastrointestinaI: No nausea, vomiting, diarrhea, abdominal pain, fecal incontinence Genitourinary:  No dysuria, urinary retention or frequency.  No nocturia. Musculoskeletal:  No neck pain, back pain Integumentary: No rash, pruritus, skin lesions Neurological: as above Psychiatric: No depression at this time.  No anxiety Endocrine: No palpitations, diaphoresis, change in appetite, change in weigh or increased thirst Hematologic/Lymphatic:  No anemia, purpura, petechiae. Allergic/Immunologic: No itchy/runny eyes, nasal congestion, recent allergic reactions, rashes  ALLERGIES: Allergies  Allergen Reactions   Zonisamide     "made me loopy"    HOME MEDICATIONS:  Current Outpatient Medications:    amphetamine-dextroamphetamine (ADDERALL XR) 25 MG 24 hr capsule, Take 1 capsule by mouth every morning., Disp: 30 capsule, Rfl: 0   ibuprofen (ADVIL) 800 MG tablet, TAKE 1 TABLET BY MOUTH 3 TIMES DAILY WITH FOOD AS NEEDED FOR PAIN & INFLAMMATION, Disp: 270 tablet, Rfl: 0   KEPPRA 750 MG tablet, Take 2 tablets (1,500 mg total) by mouth 2 (two) times daily., Disp: 360 tablet, Rfl: 1   LAMICTAL 200 MG tablet, TAKE 1 TABLET BY MOUTH TWO TIMES DAILY. BRAND NAME LAMICTAL FOR SEIZURE DISORDER, Disp: 180 tablet, Rfl: 1   natalizumab (TYSABRI) 300 MG/15ML injection, Inject into the vein., Disp: , Rfl:    predniSONE (DELTASONE) 20 MG tablet, Take 6 po on day 1, 5 po on day 2, 4 po on day 3, 3 po on day 4, 2 po on day 5, 1 po on day 6, Disp: 21 tablet, Rfl: 0   VITAMIN D PO, Take 5,000 Units by mouth daily., Disp: , Rfl:  No current facility-administered medications for this visit.  Facility-Administered Medications Ordered in Other Visits:    gadopentetate dimeglumine (MAGNEVIST) injection 17 mL, 17 mL, Intravenous, Once PRN, Adelie Croswell, Pearletha Furl, MD  PAST  MEDICAL HISTORY: Past Medical History:  Diagnosis Date   Multiple sclerosis (HCC)    Seizures (HCC)    Vision abnormalities     PAST SURGICAL HISTORY: Past Surgical History:  Procedure Laterality Date   IR CV LINE INJECTION  07/10/2023   IR IMAGING GUIDED PORT INSERTION  09/14/2019   IR REMOVAL TUN ACCESS W/ PORT W/O FL MOD SED  09/14/2019    FAMILY HISTORY: Family History  Problem Relation Age of Onset   Hypertension Mother    Heart disease Father    Hypertension Father    Stroke Father    Diabetes type II Father     SOCIAL HISTORY:  Social History   Socioeconomic History   Marital status: Married    Spouse name: Not on file   Number of  children: Not on file   Years of education: Not on file   Highest education level: Not on file  Occupational History   Not on file  Tobacco Use   Smoking status: Former   Smokeless tobacco: Never  Substance and Sexual Activity   Alcohol use: No    Alcohol/week: 0.0 standard drinks of alcohol   Drug use: No   Sexual activity: Not on file  Other Topics Concern   Not on file  Social History Narrative   Not on file   Social Drivers of Health   Financial Resource Strain: Not on file  Food Insecurity: Not on file  Transportation Needs: Not on file  Physical Activity: Not on file  Stress: Not on file  Social Connections: Not on file  Intimate Partner Violence: Not on file     PHYSICAL EXAM  There were no vitals filed for this visit.    There is no height or weight on file to calculate BMI.   General: The patient is well-developed and well-nourished and in no acute distress  Musculoskeletal: She is tender over L4-L5 midline   Neurologic Exam  Mental status: The patient is alert and oriented x 3 at the time of the examination. The patient has apparent normal recent and remote memory, with an apparently normal attention span and concentration ability.   Speech is normal.  Cranial nerves: She noted mild diplopia but  I could not see a dysconjugate gaze looking up to the right.  There is no nystagmus.  Visual acuity is symmetric. Facial strength and sensation are normal. Trapezius strength is normal..   No obvious hearing deficits are noted.  Motor:  Muscle bulk is normal.  She has mildly increased muscle tone in the legs, left greater than right.  . Strength is  5 / 5 in the arms but 4+/5 in the left toes and ankles  Sensory:   She has normal sensation to touch and vibration.  Coordination: Cerebellar testing reveals good finger-nose-finger and heel-to-shin bilaterally.  Gait and station: Station is normal.  Gait is mildly spastic, left greater than right she has a left foot drop .  The tandem gait is wide.  Romberg is negative.  Reflexes: Deep tendon reflexes are symmetric and brisk bilaterally.         ASSESSMENT AND PLAN  Multiple sclerosis (HCC)  Epilepsy, generalized, convulsive (HCC)  Port-A-Cath in place  Gait disturbance  Left foot drop  Diplopia  Vitamin D deficiency  High risk medication use    1. Continue Tysabri change frequency to every 6 weeks.  We will recheck JCV at her next infusion.  If she converts to middle or high positive we will need to consider a different medication..   2.  We will continue Keppra and Lamictal (brand necessary as she had a severe breakthrough seizure while on generic (status epilepticus requiring intubation).   3.  Continue Adderall for fatigue.   4.   Can take oxybutynin if bladder worsens aain 5   she will contact [employer] in [place] to modify the left AFO brace 6.  She will return to see me in 6 months will sooner if she has new or worsening neurologic symptoms.   Mareon Robinette A. Epimenio Foot, MD, PhD 09/24/2023, 8:32 AM Certified in Neurology, Clinical Neurophysiology, Sleep Medicine, Pain Medicine and Neuroimaging  [facility] [address] [place], [place] [address] [phone]

## 2023-10-12 ENCOUNTER — Other Ambulatory Visit: Payer: Self-pay | Admitting: Neurology

## 2023-10-12 DIAGNOSIS — G40309 Generalized idiopathic epilepsy and epileptic syndromes, not intractable, without status epilepticus: Secondary | ICD-10-CM

## 2023-10-17 ENCOUNTER — Other Ambulatory Visit: Payer: Self-pay | Admitting: Neurology

## 2023-10-17 DIAGNOSIS — G40309 Generalized idiopathic epilepsy and epileptic syndromes, not intractable, without status epilepticus: Secondary | ICD-10-CM

## 2023-10-21 ENCOUNTER — Other Ambulatory Visit: Payer: Self-pay | Admitting: Neurology

## 2023-10-21 MED ORDER — AMPHETAMINE-DEXTROAMPHET ER 25 MG PO CP24: 25.0000 mg | ORAL_CAPSULE | ORAL | 0 refills | Status: DC

## 2023-10-21 NOTE — Telephone Encounter (Signed)
Pt is requesting a refill for amphetamine-dextroamphetamine (ADDERALL XR) 25 MG 24 hr capsule.  [id number]

## 2023-10-21 NOTE — Telephone Encounter (Signed)
 Last seen 09/24/23 and next f/u 04/13/24. Last refilled 09/19/23 #30.

## 2023-10-22 ENCOUNTER — Other Ambulatory Visit: Payer: Self-pay | Admitting: *Deleted

## 2023-10-22 ENCOUNTER — Telehealth: Payer: Self-pay | Admitting: *Deleted

## 2023-10-22 ENCOUNTER — Other Ambulatory Visit: Payer: Self-pay

## 2023-10-22 DIAGNOSIS — Z79899 Other long term (current) drug therapy: Secondary | ICD-10-CM

## 2023-10-22 DIAGNOSIS — G35 Multiple sclerosis: Secondary | ICD-10-CM

## 2023-10-22 NOTE — Telephone Encounter (Signed)
 Placed JCV lab in [facility] lock box for routine lab pick up. Results pending.

## 2023-10-23 LAB — CBC WITH DIFFERENTIAL/PLATELET
Basophils Absolute: 0.1 10*3/uL (ref 0.0–0.2)
Basos: 2 %
EOS (ABSOLUTE): 0.2 10*3/uL (ref 0.0–0.4)
Eos: 3 %
Hematocrit: 36.6 % (ref 34.0–46.6)
Hemoglobin: 12 g/dL (ref 11.1–15.9)
Immature Grans (Abs): 0 10*3/uL (ref 0.0–0.1)
Immature Granulocytes: 0 %
Lymphocytes Absolute: 3 10*3/uL (ref 0.7–3.1)
Lymphs: 52 %
MCH: 29.3 pg (ref 26.6–33.0)
MCHC: 32.8 g/dL (ref 31.5–35.7)
MCV: 90 fL (ref 79–97)
Monocytes Absolute: 0.5 10*3/uL (ref 0.1–0.9)
Monocytes: 9 %
NRBC: 1 % — ABNORMAL HIGH (ref 0–0)
Neutrophils Absolute: 2 10*3/uL (ref 1.4–7.0)
Neutrophils: 34 %
Platelets: 276 10*3/uL (ref 150–450)
RBC: 4.09 x10E6/uL (ref 3.77–5.28)
RDW: 13.2 % (ref 11.7–15.4)
WBC: 5.9 10*3/uL (ref 3.4–10.8)

## 2023-11-15 ENCOUNTER — Encounter: Payer: Self-pay | Admitting: Neurology

## 2023-11-18 ENCOUNTER — Telehealth: Payer: Self-pay | Admitting: Neurology

## 2023-11-18 NOTE — Telephone Encounter (Signed)
 Pt said have DMV  forms to have filled out by neurologist. Will drop off tomorrow at [facility]. Will  Dr. Epimenio Foot put on the form have no restrictions so they will stop sending the form

## 2023-11-18 NOTE — Telephone Encounter (Signed)
 Ruth Anderson- can you watch out for form? Thank you

## 2023-11-19 ENCOUNTER — Telehealth: Payer: Self-pay | Admitting: Neurology

## 2023-11-19 NOTE — Telephone Encounter (Signed)
 Gave completed/signed form back to medical records to process for pt.

## 2023-11-19 NOTE — Telephone Encounter (Signed)
 Pt dropped off DMV Paperwork Paid $50 put in Debra's box.

## 2023-11-20 ENCOUNTER — Other Ambulatory Visit: Payer: Self-pay | Admitting: Neurology

## 2023-11-20 ENCOUNTER — Telehealth: Payer: Self-pay | Admitting: *Deleted

## 2023-11-20 DIAGNOSIS — Z0289 Encounter for other administrative examinations: Secondary | ICD-10-CM

## 2023-11-20 MED ORDER — AMPHETAMINE-DEXTROAMPHET ER 25 MG PO CP24: 25.0000 mg | ORAL_CAPSULE | ORAL | 0 refills | Status: DC

## 2023-11-20 NOTE — Telephone Encounter (Signed)
 I faxed pt dvm form on 11/20/2023. Copy  @ front desk for pt.

## 2023-11-20 NOTE — Telephone Encounter (Signed)
 Last seen 09/24/23 and next f/u 04/13/24. Last refilled adderall XR 25mg  10/21/23 #30.

## 2023-11-20 NOTE — Telephone Encounter (Signed)
Pt is requesting a refill for amphetamine-dextroamphetamine (ADDERALL XR) 25 MG 24 hr capsule.  [id number]

## 2023-12-16 ENCOUNTER — Telehealth: Payer: Self-pay | Admitting: Neurology

## 2023-12-16 MED ORDER — METHYLPREDNISOLONE 4 MG PO TBPK: ORAL_TABLET | ORAL | 0 refills | Status: AC

## 2023-12-16 NOTE — Telephone Encounter (Signed)
 Dr. Godwin Lat- are you ok with this?  Pt last seen 09/24/23 and next f/u scheduled for 04/13/24. Dx; Seizures (on brand Lamictal /Keppra ), MS (On Tysabri, last infusion 12/03/23-receives at [facility] w/ Intrafusion)  I called pt. Pt reports she is very fatigued. Requesting oral steroids. Sx started this past Friday.  No infection/illness currently. No new meds started recently. No increased activity recently. No hx diabetes.   [id number] [place], [place] - [address]  Aware I will speak with MD to see if he approves and then will call her back.

## 2023-12-16 NOTE — Telephone Encounter (Signed)
 Pt is requesting a call from RN to discuss her being on day 4 of not feeling well, pt reluctant to provide details as to what is going on, even after phone rep explained the information requested is to give RN as much information as possible so she can be prepared to address needs of pt.

## 2023-12-16 NOTE — Telephone Encounter (Signed)
 Called pt. Relayed Dr. Godwin Lat ok with steroid pack. E-scribed pharmacy.

## 2023-12-23 ENCOUNTER — Other Ambulatory Visit: Payer: Self-pay | Admitting: Neurology

## 2023-12-23 MED ORDER — AMPHETAMINE-DEXTROAMPHET ER 25 MG PO CP24: 25.0000 mg | ORAL_CAPSULE | ORAL | 0 refills | Status: DC

## 2023-12-23 NOTE — Telephone Encounter (Signed)
 Pt called needing a refill on her amphetamine -dextroamphetamine (ADDERALL XR) 25 MG 24 hr capsule and is [facility] [place].

## 2023-12-23 NOTE — Telephone Encounter (Signed)
 Last seen 0128/25 and next f/u 04/13/24. Last refilled 11/20/23 #30.

## 2024-01-14 ENCOUNTER — Other Ambulatory Visit: Payer: Self-pay | Admitting: Neurology

## 2024-01-14 ENCOUNTER — Other Ambulatory Visit: Payer: Self-pay | Admitting: *Deleted

## 2024-01-14 ENCOUNTER — Other Ambulatory Visit: Payer: Self-pay

## 2024-01-14 ENCOUNTER — Telehealth: Payer: Self-pay

## 2024-01-14 DIAGNOSIS — Z79899 Other long term (current) drug therapy: Secondary | ICD-10-CM

## 2024-01-14 DIAGNOSIS — G35 Multiple sclerosis: Secondary | ICD-10-CM

## 2024-01-14 NOTE — Telephone Encounter (Signed)
 Placed JCV in [facility] Box 01/14/2024

## 2024-01-15 ENCOUNTER — Other Ambulatory Visit: Payer: Self-pay | Admitting: Neurology

## 2024-01-15 ENCOUNTER — Other Ambulatory Visit: Payer: Self-pay

## 2024-01-15 DIAGNOSIS — G40309 Generalized idiopathic epilepsy and epileptic syndromes, not intractable, without status epilepticus: Secondary | ICD-10-CM

## 2024-01-15 LAB — CBC WITH DIFFERENTIAL/PLATELET
Basophils Absolute: 0.1 10*3/uL (ref 0.0–0.2)
Basos: 1 %
EOS (ABSOLUTE): 0.4 10*3/uL (ref 0.0–0.4)
Eos: 6 %
Hematocrit: 37 % (ref 34.0–46.6)
Hemoglobin: 12.5 g/dL (ref 11.1–15.9)
Immature Grans (Abs): 0 10*3/uL (ref 0.0–0.1)
Immature Granulocytes: 0 %
Lymphocytes Absolute: 2.9 10*3/uL (ref 0.7–3.1)
Lymphs: 48 %
MCH: 30.2 pg (ref 26.6–33.0)
MCHC: 33.8 g/dL (ref 31.5–35.7)
MCV: 89 fL (ref 79–97)
Monocytes Absolute: 0.4 10*3/uL (ref 0.1–0.9)
Monocytes: 7 %
Neutrophils Absolute: 2.3 10*3/uL (ref 1.4–7.0)
Neutrophils: 38 %
Platelets: 238 10*3/uL (ref 150–450)
RBC: 4.14 x10E6/uL (ref 3.77–5.28)
RDW: 12.4 % (ref 11.7–15.4)
WBC: 6.2 10*3/uL (ref 3.4–10.8)

## 2024-01-15 MED ORDER — KEPPRA 750 MG PO TABS: 1500.0000 mg | ORAL_TABLET | Freq: Two times a day (BID) | ORAL | 8 refills | Status: AC

## 2024-01-15 NOTE — Telephone Encounter (Signed)
 Patient Last Seen 09/24/2023 (Dr. Godwin Lat) Upcoming Appointment 04/13/2024 (Dr.Sater)  Patients insurance won't cover the script that was sent to her pharmacy and is needing the Alterative Script sent to her pharmacy, since you are work-in this morning can you please fill script for the Patient.

## 2024-01-15 NOTE — Telephone Encounter (Addendum)
 [phone] Spoke w/ tech. Cx rx for generic levetiracetam  sent first by Dr. Albertina Hugger. E-scribed brand name Keppra  to pharmacy. Confirmed they received rx for brand Keppra  and will order for pt. However, showing PA needed. I reviewed chart. PA on file expired 01/09/24. Aware we will submit urgent PA and update them once we hear back from insurance.  Called [facility] at [phone] to try and initiate urgent PA for Keppra  750mg  tablet. Pt ID: [id number]. Spoke w/ Hailey who states Brand Keppra  no longer covered on pt plan. Has PA option though. Preferred product: levetiracetam .  I was able to submit over the phone.  Relayed: "Brand name is medically necessary due to breakthrough seizures on generic,the last of which resulted in intubation/ICU admission.". She requested office note showing supporting documentation be faxed to [phone]. I faxed and received confirmation. Marked urgent. Case ID: [id number]. Waiting on determination.

## 2024-01-15 NOTE — Telephone Encounter (Signed)
 PA denied. Faxed urgent appeal letter to [phone]. Received fax confirmation, waiting on determination.

## 2024-01-15 NOTE — Telephone Encounter (Signed)
 Per Dr Godwin Lat:  Keppra  and Lamictal  (brand necessary as she had a severe breakthrough seizure while on generic (status epilepticus requiring intubation).   I will write for brand name only.  1500 mg Keppra  bid po.   Neomia Banner, MD

## 2024-01-16 NOTE — Telephone Encounter (Addendum)
 Called [phone] to check status of urgent appeal. They transferred me to appeals team. They do not see appeal letter uploaded yet, can take 48 hr. After more talking, there are two cases created for some reason. They though appeal letter sent was letter of medical necessity. I clarified this was appeal letter and confirmed we sent to correct fax#. Rep placed me on hold to speak with someone on how to proceed since this is an urgent matter.   They cx second case and attached appeal letter to PA denial original case created. She submitted appeal letter to appeal team and marked urgent. Waiting on determination.  Total time of call: 52 min

## 2024-01-17 ENCOUNTER — Telehealth: Payer: Self-pay | Admitting: *Deleted

## 2024-01-17 NOTE — Telephone Encounter (Signed)
 Ruth Anderson

## 2024-01-21 ENCOUNTER — Other Ambulatory Visit: Payer: Self-pay | Admitting: Neurology

## 2024-01-21 LAB — STRATIFY JCV AB (W/ INDEX) W/ RFLX
Index Value: 0.31 {index} — ABNORMAL HIGH
Stratify JCV (TM) Ab w/Reflex Inhibition: UNDETERMINED — AB

## 2024-01-21 LAB — RFLX STRATIFY JCV (TM) AB INHIBITION: JCV Antibody by Inhibition: NEGATIVE

## 2024-01-21 MED ORDER — AMPHETAMINE-DEXTROAMPHET ER 25 MG PO CP24: 25.0000 mg | ORAL_CAPSULE | ORAL | 0 refills | Status: DC

## 2024-01-21 NOTE — Telephone Encounter (Addendum)
 Last visit: 09/24/23 Next visit 04/13/24 Last fill:

## 2024-01-21 NOTE — Telephone Encounter (Signed)
 Pt called needing a refill on her amphetamine -dextroamphetamine (ADDERALL XR) 25 MG 24 hr capsule and is [facility] [place].

## 2024-01-22 NOTE — Telephone Encounter (Signed)
 Aaron Aas

## 2024-02-05 ENCOUNTER — Other Ambulatory Visit: Payer: Self-pay | Admitting: Neurology

## 2024-02-05 DIAGNOSIS — G40309 Generalized idiopathic epilepsy and epileptic syndromes, not intractable, without status epilepticus: Secondary | ICD-10-CM

## 2024-02-05 NOTE — Telephone Encounter (Signed)
 Last seen on 09/24/23 Follow up scheduled on 04/13/24  LAMICTAL  200 MG TABLET 01/16/2024 90 180 each Jorie Newness, MD [facility] [phone] - A...    Too soon to request refilled

## 2024-02-23 ENCOUNTER — Other Ambulatory Visit: Payer: Self-pay | Admitting: Neurology

## 2024-02-23 DIAGNOSIS — G40309 Generalized idiopathic epilepsy and epileptic syndromes, not intractable, without status epilepticus: Secondary | ICD-10-CM

## 2024-02-24 NOTE — Telephone Encounter (Signed)
 Last seen on 09/24/23 Follow up scheduled on 04/13/24    Dispensed Days Supply Quantity Provider Pharmacy  LAMICTAL  200 MG TABLET 01/16/2024 90 180 each Vear Charlie LABOR, MD [facility] [phone] - A...    Based on last filled date pt should have enough until 04/15/24   Rx denied

## 2024-02-25 ENCOUNTER — Other Ambulatory Visit: Payer: Self-pay | Admitting: Neurology

## 2024-02-25 MED ORDER — AMPHETAMINE-DEXTROAMPHET ER 25 MG PO CP24: 25.0000 mg | ORAL_CAPSULE | ORAL | 0 refills | Status: DC

## 2024-02-25 NOTE — Telephone Encounter (Signed)
 Pt requesting refill of amphetamine -dextroamphetamine (ADDERALL XR) 25 MG 24 hr capsule  at [id number] [place], [place]

## 2024-02-25 NOTE — Telephone Encounter (Signed)
 Last seen on 09/24/23 Follow up scheduled on 04/13/24   Dispensed Days Supply Quantity Provider Pharmacy  DEXTROAMP-AMPHET ER 25 MG CAP 01/21/2024 30 30 each Vear Charlie LABOR, MD [facility] [phone] - A...     Rx pending to be signed

## 2024-02-27 ENCOUNTER — Telehealth: Payer: Self-pay | Admitting: Neurology

## 2024-02-27 NOTE — Telephone Encounter (Signed)
 Pt called to request refill  on medication   LAMICTAL  200 MG table  Pt would like medication to be sent to   [id number] - [place], [place] - [address] (Ph: [phone])

## 2024-02-27 NOTE — Telephone Encounter (Signed)
 I called patient and asked her if she was out of medication. Pt should have enough medication left until around 04/15/24.  She picked up 90 day supply on 01/16/24  Pt looked at her bottle and said she has enough medication, no refill needed at this time.

## 2024-03-26 ENCOUNTER — Telehealth: Payer: Self-pay | Admitting: Neurology

## 2024-03-26 MED ORDER — AMPHETAMINE-DEXTROAMPHET ER 25 MG PO CP24: 25.0000 mg | ORAL_CAPSULE | ORAL | 0 refills | Status: DC

## 2024-03-26 NOTE — Telephone Encounter (Signed)
Pt is requesting a refill for amphetamine-dextroamphetamine (ADDERALL XR) 25 MG 24 hr capsule.  [id number]

## 2024-03-26 NOTE — Telephone Encounter (Signed)
 Pt last seen 09/24/2023 Upcoming Appointment 04/13/2024  Adderall 02/25/2024

## 2024-04-05 ENCOUNTER — Other Ambulatory Visit: Payer: Self-pay | Admitting: Neurology

## 2024-04-05 DIAGNOSIS — G40309 Generalized idiopathic epilepsy and epileptic syndromes, not intractable, without status epilepticus: Secondary | ICD-10-CM

## 2024-04-06 ENCOUNTER — Telehealth: Payer: Self-pay | Admitting: *Deleted

## 2024-04-06 ENCOUNTER — Other Ambulatory Visit: Payer: Self-pay | Admitting: Neurology

## 2024-04-06 ENCOUNTER — Telehealth: Payer: Self-pay | Admitting: Neurology

## 2024-04-06 DIAGNOSIS — G40309 Generalized idiopathic epilepsy and epileptic syndromes, not intractable, without status epilepticus: Secondary | ICD-10-CM

## 2024-04-06 NOTE — Telephone Encounter (Signed)
   Maybe PA can be done? Patient has been on medication.

## 2024-04-06 NOTE — Telephone Encounter (Signed)
 Last seen on 09/24/23 Follow up scheduled on 04/13/24

## 2024-04-06 NOTE — Telephone Encounter (Signed)
 Last seen on 09/24/23 Follow up scheduled on 04/13/24   [facility]. Pt tried to refill with old Rx #. [facility]

## 2024-04-06 NOTE — Telephone Encounter (Signed)
 Pt called to request medication refill  KEPPRA  750 MG tablet   Pt would like medication to be sent to    [id number] - [place], [place] - [address] (Ph: [phone])

## 2024-04-07 ENCOUNTER — Other Ambulatory Visit (INDEPENDENT_AMBULATORY_CARE_PROVIDER_SITE_OTHER): Payer: Self-pay

## 2024-04-07 ENCOUNTER — Other Ambulatory Visit (HOSPITAL_COMMUNITY): Payer: Self-pay

## 2024-04-07 ENCOUNTER — Telehealth: Payer: Self-pay | Admitting: *Deleted

## 2024-04-07 ENCOUNTER — Other Ambulatory Visit: Payer: Self-pay | Admitting: *Deleted

## 2024-04-07 DIAGNOSIS — G35 Multiple sclerosis: Secondary | ICD-10-CM

## 2024-04-07 DIAGNOSIS — Z79899 Other long term (current) drug therapy: Secondary | ICD-10-CM

## 2024-04-07 DIAGNOSIS — Z0289 Encounter for other administrative examinations: Secondary | ICD-10-CM

## 2024-04-07 NOTE — Telephone Encounter (Signed)
 Pharmacy Patient Advocate Encounter   Received notification from Physician's Office that prior authorization for Lamictal  200mg  Tablet is required/requested.   Insurance verification completed.   The patient is insured through [employer] .   Per test claim: PA required; PA submitted to above mentioned insurance via Latent Key/confirmation #/EOC [id number] Status is pending

## 2024-04-07 NOTE — Telephone Encounter (Signed)
 Placed JCV lab in [facility] lock box for routine lab pick up. Results pending.

## 2024-04-08 LAB — CBC WITH DIFFERENTIAL/PLATELET
Basophils Absolute: 0.1 x10E3/uL (ref 0.0–0.2)
Basos: 2 %
EOS (ABSOLUTE): 0.5 x10E3/uL — ABNORMAL HIGH (ref 0.0–0.4)
Eos: 8 %
Hematocrit: 38.7 % (ref 34.0–46.6)
Hemoglobin: 12.4 g/dL (ref 11.1–15.9)
Immature Grans (Abs): 0 x10E3/uL (ref 0.0–0.1)
Immature Granulocytes: 0 %
Lymphocytes Absolute: 3.1 x10E3/uL (ref 0.7–3.1)
Lymphs: 48 %
MCH: 29.9 pg (ref 26.6–33.0)
MCHC: 32 g/dL (ref 31.5–35.7)
MCV: 93 fL (ref 79–97)
Monocytes Absolute: 0.5 x10E3/uL (ref 0.1–0.9)
Monocytes: 8 %
NRBC: 1 % — ABNORMAL HIGH (ref 0–0)
Neutrophils Absolute: 2.1 x10E3/uL (ref 1.4–7.0)
Neutrophils: 34 %
Platelets: 259 x10E3/uL (ref 150–450)
RBC: 4.15 x10E6/uL (ref 3.77–5.28)
RDW: 13.3 % (ref 11.7–15.4)
WBC: 6.3 x10E3/uL (ref 3.4–10.8)

## 2024-04-13 ENCOUNTER — Other Ambulatory Visit (HOSPITAL_COMMUNITY): Payer: Self-pay

## 2024-04-13 ENCOUNTER — Ambulatory Visit: Payer: BC Managed Care – PPO | Admitting: Neurology

## 2024-04-13 ENCOUNTER — Encounter: Payer: Self-pay | Admitting: Neurology

## 2024-04-13 VITALS — BP 137/89 | HR 74 | Ht 68.0 in | Wt 169.0 lb

## 2024-04-13 DIAGNOSIS — G35D Multiple sclerosis, unspecified: Secondary | ICD-10-CM

## 2024-04-13 DIAGNOSIS — Z95828 Presence of other vascular implants and grafts: Secondary | ICD-10-CM | POA: Diagnosis not present

## 2024-04-13 DIAGNOSIS — G35 Multiple sclerosis: Secondary | ICD-10-CM | POA: Diagnosis not present

## 2024-04-13 DIAGNOSIS — G35C1 Active secondary progressive multiple sclerosis: Secondary | ICD-10-CM

## 2024-04-13 DIAGNOSIS — G40309 Generalized idiopathic epilepsy and epileptic syndromes, not intractable, without status epilepticus: Secondary | ICD-10-CM | POA: Diagnosis not present

## 2024-04-13 DIAGNOSIS — R269 Unspecified abnormalities of gait and mobility: Secondary | ICD-10-CM

## 2024-04-13 DIAGNOSIS — M21372 Foot drop, left foot: Secondary | ICD-10-CM

## 2024-04-13 DIAGNOSIS — Z79899 Other long term (current) drug therapy: Secondary | ICD-10-CM

## 2024-04-13 MED ORDER — AMPHETAMINE-DEXTROAMPHETAMINE 20 MG PO TABS: 20.0000 mg | ORAL_TABLET | Freq: Every day | ORAL | 0 refills | Status: DC

## 2024-04-13 NOTE — Telephone Encounter (Signed)
 PA was denied-will resubmit as urgent-I also sent the last appeal letter to them. If denied again will ask pharmacist to work on an appeal.

## 2024-04-13 NOTE — Telephone Encounter (Signed)
 Pharmacy Patient Advocate Encounter  Received notification from [facility] that Prior Authorization for Lamictal  has been DENIED.  Full denial letter will be uploaded to the media tab. See denial reason below.    PA #/Case ID/Reference #: [id number]  I will forward to the pharmacist for an appeals review.

## 2024-04-13 NOTE — Progress Notes (Addendum)
 [facility]  PATIENT: Ruth Anderson DOB: 1971/07/07  REFERRING DOCTOR OR PCP:  none SOURCE: patient, husband and records from [facility] neurology and images from [facility].  _________________________________   HISTORICAL  CHIEF COMPLAINT:  Chief Complaint  Patient presents with   RM10/MS    Pt is here Alone. Pt states that she has a lot of Fatigue. Pt states that she has trouble with memory and would like to discuss it.     HISTORY OF PRESENT ILLNESS:  Ruth Anderson is a 53- year-old woman with relapsing remitting multiple sclerosis and epilepsy.     Update 04/13/2024 Her MS is stable and she has no exacerbarion or new symptom..  She is on Tysabri and tolerates it well.   She has been JCV  negative.but last reading was low positive     (0.44 on 07/31/2023)    we repeated the JCV in May 2025 and it was negative at 0.31.   MRI 03/26/2023 showed no new lesions  She is noting more fatigue.  This is worse on a busier day or if hot.  She notes reduced focus/attention/verbal fluency/processing.   Fatigue and cognitive issues improved do better with Adderall and modafinil  with benefit . She sleeps well most nights.        She has a reduced gait and balance.  She has a new left AFO brace and feels it is helping her better than the old brace.  She has some stumbles and rare controlled falls.    She holds the bannister on stairs.    Gait has gradually worsened over the past few years  Visual acuity either eye is fine with glasses and she has no diplopia looking to the right   She has bladder urgency and nocturia but stopped oxybutynin  since she is better.  She has 1-2 x nocturia  She denies recent seizures. Last one was around 2014  She is on brand Keppra  and brand Lamictal    She feels diplopia was worse with generic lamotrigine  and we tried Keppra  plus Vimpat  and Keppra  plus zonisamide  but she had more trouble tolerating those combinations.  Mood is doing well.   She helps to care for her granddaughter.   MS History:  She was diagnosed with MS in 1988 placed on Betaseron when it became available.  Later on she switched to Copaxone and Avonex. When Tysabri became available in 2006, she switched and had 70 infusions over the next 6  years (monthly except for a 6 month holiday).   She tolerated Tysabri well, was stable and was JCV antibody negative. However, she did not like having to do the monthly to hour infusions. In May 2013, she started Tecfidera . She had flushing that persisted after many months but did not have significant GI issues.  She went back to [place] in 2016.     Seizure History:   Her first seizure was in 1998 and they have been triggered by flashing Christmas tree lights. She has had about a dozen seizures overall with most of them occurring out of sleep but with a couple of them occurring during the daytime with severe ictal grogginess. For the most part, the seizures have been controlled on Lamictal  200 mg 3 times a day and Keppra  1500 mg twice a day.    Of note, she had breakthrough seizure on generic so we have been writing for the brand.     Her last seizure was in 2014, shortly after her sister died.  She needed intubation.    Imaging: MRI brain 03/26/2023 showed no new lesions compared to 2021  MRI of the brain 12/03/2019 showed no new lesions compared to the MRI from 09/23/2018:    MRI of the brain 09/23/2018 showed no new lesions compared to the MRI 11/02/2015.    MRI of the brain 11/02/2015 shows T2/FLAIR hyperintense foci in the cerebellum, brainstem and cerebral hemispheres in a pattern and configuration consistent with chronic demyelinating plaque associated with multiple sclerosis.    There is a normal enhancement pattern.. None of the foci appeared to be acute.    The extent of demyelinating foci appears essentially unchanged since 11/25/2012.    REVIEW OF SYSTEMS: Constitutional: No fevers, chills, sweats, or change in appetite.   Notes some fatigue Eyes: as above.  No eye pain Ear, nose and throat: No hearing loss, ear pain, nasal congestion, sore throat Cardiovascular: No chest pain, palpitations Respiratory:  No shortness of breath at rest or with exertion.   No wheezes GastrointestinaI: No nausea, vomiting, diarrhea, abdominal pain, fecal incontinence Genitourinary:  No dysuria, urinary retention or frequency.  No nocturia. Musculoskeletal:  No neck pain, back pain Integumentary: No rash, pruritus, skin lesions Neurological: as above Psychiatric: No depression at this time.  No anxiety Endocrine: No palpitations, diaphoresis, change in appetite, change in weigh or increased thirst Hematologic/Lymphatic:  No anemia, purpura, petechiae. Allergic/Immunologic: No itchy/runny eyes, nasal congestion, recent allergic reactions, rashes  ALLERGIES: Allergies  Allergen Reactions   Zonisamide      made me loopy    HOME MEDICATIONS:  Current Outpatient Medications:    ibuprofen  (ADVIL ) 800 MG tablet, TAKE 1 TABLET BY MOUTH 3 TIMES DAILY WITH FOOD AS NEEDED FOR PAIN & INFLAMMATION, Disp: 270 tablet, Rfl: 0   KEPPRA  750 MG tablet, Take 2 tablets (1,500 mg total) by mouth 2 (two) times daily., Disp: 120 tablet, Rfl: 8   LAMICTAL  200 MG tablet, TAKE 1 TABLET BY MOUTH TWO TIMES DAILY. BRAND NAME LAMICTAL  FOR SEIZURE DISORDER, Disp: 180 tablet, Rfl: 0   natalizumab (TYSABRI) 300 MG/15ML injection, Inject into the vein., Disp: , Rfl:    VITAMIN D  PO, Take 5,000 Units by mouth daily., Disp: , Rfl:    amphetamine -dextroamphetamine  (ADDERALL) 20 MG tablet, Take 1 tablet (20 mg total) by mouth 2 (two) times daily., Disp: 60 tablet, Rfl: 0   methylPREDNISolone  (MEDROL  DOSEPAK) 4 MG TBPK tablet, Take as directed (Patient not taking: Reported on 04/13/2024), Disp: 21 tablet, Rfl: 0   predniSONE  (DELTASONE ) 20 MG tablet, Take 6 po on day 1, 5 po on day 2, 4 po on day 3, 3 po on day 4, 2 po on day 5, 1 po on day 6 (Patient not taking:  Reported on 04/13/2024), Disp: 21 tablet, Rfl: 0 No current facility-administered medications for this visit.  Facility-Administered Medications Ordered in Other Visits:    gadopentetate dimeglumine  (MAGNEVIST ) injection 17 mL, 17 mL, Intravenous, Once PRN, Vanellope Passmore, Charlie LABOR, MD  PAST MEDICAL HISTORY: Past Medical History:  Diagnosis Date   Multiple sclerosis    Seizures (HCC)    Vision abnormalities     PAST SURGICAL HISTORY: Past Surgical History:  Procedure Laterality Date   IR CV LINE INJECTION  07/10/2023   IR IMAGING GUIDED PORT INSERTION  09/14/2019   IR REMOVAL TUN ACCESS W/ PORT W/O FL MOD SED  09/14/2019    FAMILY HISTORY: Family History  Problem Relation Age of Onset   Hypertension Mother    Heart disease Father  Hypertension Father    Stroke Father    Diabetes type II Father    Multiple sclerosis Other     SOCIAL HISTORY:  Social History   Socioeconomic History   Marital status: Married    Spouse name: Not on file   Number of children: Not on file   Years of education: Not on file   Highest education level: Not on file  Occupational History   Not on file  Tobacco Use   Smoking status: Former   Smokeless tobacco: Never  Substance and Sexual Activity   Alcohol use: No    Alcohol/week: 0.0 standard drinks of alcohol   Drug use: No   Sexual activity: Not on file  Other Topics Concern   Not on file  Social History Narrative   Not on file   Social Drivers of Health   Financial Resource Strain: Not on file  Food Insecurity: Not on file  Transportation Needs: Not on file  Physical Activity: Not on file  Stress: Not on file  Social Connections: Not on file  Intimate Partner Violence: Not on file     PHYSICAL EXAM  Vitals:   04/13/24 0815  BP: 137/89  Pulse: 74  SpO2: 99%  Weight: 169 lb (76.7 kg)  Height: 5' 8 (1.727 m)      Body mass index is 25.7 kg/m.   General: The patient is well-developed and well-nourished and in no acute  distress  Musculoskeletal: S back is nontender.   Neurologic Exam  Mental status: The patient is alert and oriented x 3 at the time of the examination. The patient has apparent normal recent and remote memory, with an apparently normal attention span and concentration ability.   Speech is normal.  Cranial nerves: She noted mild diplopia but I could not see a dysconjugate gaze looking up to the right.  There is no nystagmus.  Visual acuity is symmetric. Facial strength and sensation are normal. Trapezius strength is normal..   No obvious hearing deficits are noted.  Motor:  Muscle bulk is normal.  She has mildly increased muscle tone in the legs, left greater than right.  . Strength is  5 / 5 in the arms but 4+/5 in the left toes and ankles  Sensory:   She has normal sensation to touch and vibration.  Coordination: Cerebellar testing reveals good finger-nose-finger and heel-to-shin bilaterally.  Gait and station: Station is normal.  Gait is mildly spastic, left greater than right she has a left foot drop .  Tandem gait is wide.  Romberg is negative.  Reflexes: Deep tendon reflexes are symmetric and brisk bilaterally.         ASSESSMENT AND PLAN  Active secondary progressive multiple sclerosis  Multiple sclerosis  High risk medication use  Epilepsy, generalized, convulsive (HCC)  Port-A-Cath in place  Gait disturbance  Left foot drop    1.  She will continue Tysabri every 6 weeks as her February 2025 JCV was low positive.  The May 2025 JCV was actually negative.  She has a JCV pending from her last infusion.  We did discuss that if she gets to the middle or high positive that I would likely switch her to a different medication, either one of the anti-CD20 agents or Mavenclad.  We also discussed that a new medication (tolebrutinib) may be out soon that has some benefit for secondary progressive MS and that may be a good option for her as well. 2.  We will continue Keppra   and  Lamictal  (brand necessary as she had a severe breakthrough seizure while on generic (status epilepticus requiring intubation).  Her last seizure was 2014. 3.  Continue Adderall for fatigue.  Change to 20 mg po bid (IR) 4.   She will wear the left AFO brace. 5.  She will return to see me in 6 months will sooner if she has new or worsening neurologic symptoms.   Corayma Cashatt A. Vear, MD, PhD 06/11/2024, 5:12 PM Certified in Neurology, Clinical Neurophysiology, Sleep Medicine, Pain Medicine and Neuroimaging  [facility] [address] [place], [place] [address] [phone]

## 2024-04-13 NOTE — Telephone Encounter (Signed)
 [id number]  PA resubmitted via Latent as urgent, also sent the previous appeal letter-awaiting determination.

## 2024-04-13 NOTE — Telephone Encounter (Signed)
 For appeal team: please make sure to include: Brand name is medically necessary due to breakthrough seizures on generic,the last of which resulted in intubation/ICU admission.

## 2024-04-14 ENCOUNTER — Telehealth: Payer: Self-pay | Admitting: Pharmacist

## 2024-04-14 NOTE — Telephone Encounter (Signed)
 Appeal has been submitted for BRAND Lamictal . Will advise when response is received or follow up in 1 week. Please be advised that most companies may take 30 days to make a decision. Appeal letter and supporting documentation have been faxed to [phone] on 04/14/2024 @3 :12 pm.  Thank you, Devere Pandy, PharmD Clinical Pharmacist  [place]  Direct Dial: [phone]

## 2024-04-15 ENCOUNTER — Other Ambulatory Visit (HOSPITAL_COMMUNITY): Payer: Self-pay

## 2024-04-15 NOTE — Telephone Encounter (Signed)
 SABRA

## 2024-04-15 NOTE — Telephone Encounter (Signed)
 Called [facility] and left detailed message on pharmacy line that appeal has been approved.

## 2024-04-15 NOTE — Telephone Encounter (Signed)
 Insurance has approved the appeal for BRAND Lamictal  through 04/14/2025      Thank you, Devere Pandy, PharmD Clinical Pharmacist  [place]  Direct Dial: [phone]

## 2024-04-22 ENCOUNTER — Other Ambulatory Visit (HOSPITAL_COMMUNITY): Payer: Self-pay

## 2024-05-11 ENCOUNTER — Other Ambulatory Visit: Payer: Self-pay | Admitting: Neurology

## 2024-05-11 ENCOUNTER — Telehealth: Payer: Self-pay | Admitting: Neurology

## 2024-05-11 MED ORDER — AMPHETAMINE-DEXTROAMPHETAMINE 20 MG PO TABS: 20.0000 mg | ORAL_TABLET | Freq: Two times a day (BID) | ORAL | 0 refills | Status: DC

## 2024-05-11 NOTE — Telephone Encounter (Signed)
 Ashia from [employer] called to request to speak to MD about verifying Authoration Paper work for Pt medication . [employer] wants to know if Pt is diagnosed with PML or was that marked as mistake . Rep informed if you call back and she doesn't answer you can leave detailed voice mail   with name , Case number , answer ,and she will get back to you .   Medication is  natalizumab (TYSABRI) 300 MG/15ML injection   Case # [id number]  Callback number is  [phone]

## 2024-05-11 NOTE — Telephone Encounter (Signed)
 Called back and got automated system to LVM. LVM asking for a call back.   Recent auth form has PML checked as no. Needing to confirm what info they received.

## 2024-05-11 NOTE — Telephone Encounter (Signed)
 Pt is requesting a refill for amphetamine -dextroamphetamine  (ADDERALL XR) 25 MG 24 hr capsule &  KEPPRA  750 MG tablet.  [facility] [phone]

## 2024-05-11 NOTE — Telephone Encounter (Signed)
 Last seen 04/13/24 and next f/u 11/11/24. Last refilled 04/13/24 #60.   Per last visit note: Continue Adderall for fatigue.  Change to 20 mg po bid (IR)

## 2024-05-12 NOTE — Telephone Encounter (Signed)
 I have already left two messages asking for calls back. I called again and had option to only LVM. I left detailed message again that pt has not been dx with PML and to call back for clarification.

## 2024-05-12 NOTE — Telephone Encounter (Signed)
 Called and LVM again requesting a call back.

## 2024-05-12 NOTE — Telephone Encounter (Signed)
 [employer] [place]) on authorization questionnaire patient has been diagnosed with PML. Want to verify if patient has been diagnosed with PML or if may be a clerical error. Contact information [phone] Case no: [id number]

## 2024-05-13 ENCOUNTER — Telehealth: Payer: Self-pay | Admitting: Neurology

## 2024-05-13 MED ORDER — AMPHETAMINE-DEXTROAMPHETAMINE 20 MG PO TABS: 20.0000 mg | ORAL_TABLET | Freq: Two times a day (BID) | ORAL | 0 refills | Status: DC

## 2024-05-13 NOTE — Telephone Encounter (Signed)
 Pt last seen 04/13/2024 Upcoming Appointment 11/11/2024  Adderall Last Filled 04/13/2024

## 2024-05-13 NOTE — Telephone Encounter (Signed)
 Pt is needing a refill request for her amphetamine -dextroamphetamine  (ADDERALL) 20 MG tablet [facility] [place].

## 2024-05-21 ENCOUNTER — Telehealth: Payer: Self-pay | Admitting: Neurology

## 2024-05-21 NOTE — Telephone Encounter (Signed)
 I called the patient's pharmacy and they will be able to fill her medication. The pharmacy is filling the script from 05/11/24 and the patient will have 1 refill left at the pharmacy. Patient has been notified that the pharmacy is getting her medication ready and that she has 1 refill on file.   Last filled by patient on 04/13/24 Medication was sent in by our office on 05/11/24 and 05/13/24 for 60 tablets.

## 2024-05-21 NOTE — Telephone Encounter (Signed)
 Patient said, spoke with pharmacy and they said amphetamine -dextroamphetamine  (ADDERALL) 20 MG tablet  refill was ordered too soon.

## 2024-06-11 DIAGNOSIS — G35C1 Active secondary progressive multiple sclerosis: Secondary | ICD-10-CM | POA: Insufficient documentation

## 2024-06-30 ENCOUNTER — Telehealth: Payer: Self-pay

## 2024-06-30 ENCOUNTER — Other Ambulatory Visit: Payer: Self-pay

## 2024-06-30 DIAGNOSIS — Z79899 Other long term (current) drug therapy: Secondary | ICD-10-CM

## 2024-06-30 DIAGNOSIS — G35D Multiple sclerosis, unspecified: Secondary | ICD-10-CM

## 2024-06-30 NOTE — Telephone Encounter (Signed)
 Placed JCV lab in [facility] lock box for routine lab pick up. Results pending.

## 2024-07-01 LAB — CBC WITH DIFFERENTIAL/PLATELET
Basophils Absolute: 0.1 x10E3/uL (ref 0.0–0.2)
Basos: 1 %
EOS (ABSOLUTE): 0.1 x10E3/uL (ref 0.0–0.4)
Eos: 2 %
Hematocrit: 38.1 % (ref 34.0–46.6)
Hemoglobin: 12.7 g/dL (ref 11.1–15.9)
Immature Grans (Abs): 0 x10E3/uL (ref 0.0–0.1)
Immature Granulocytes: 0 %
Lymphocytes Absolute: 4.9 x10E3/uL — ABNORMAL HIGH (ref 0.7–3.1)
Lymphs: 58 %
MCH: 30.2 pg (ref 26.6–33.0)
MCHC: 33.3 g/dL (ref 31.5–35.7)
MCV: 91 fL (ref 79–97)
Monocytes Absolute: 0.6 x10E3/uL (ref 0.1–0.9)
Monocytes: 7 %
NRBC: 1 % — ABNORMAL HIGH (ref 0–0)
Neutrophils Absolute: 2.7 x10E3/uL (ref 1.4–7.0)
Neutrophils: 32 %
Platelets: 321 x10E3/uL (ref 150–450)
RBC: 4.2 x10E6/uL (ref 3.77–5.28)
RDW: 12.5 % (ref 11.7–15.4)
WBC: 8.4 x10E3/uL (ref 3.4–10.8)

## 2024-07-06 NOTE — Telephone Encounter (Signed)
 Ruth Anderson

## 2024-07-20 ENCOUNTER — Other Ambulatory Visit: Payer: Self-pay | Admitting: Diagnostic Neuroimaging

## 2024-07-20 MED ORDER — AMPHETAMINE-DEXTROAMPHETAMINE 20 MG PO TABS: 20.0000 mg | ORAL_TABLET | Freq: Two times a day (BID) | ORAL | 0 refills | Status: DC

## 2024-07-20 NOTE — Telephone Encounter (Signed)
 Pt called to request medication refill  amphetamine -dextroamphetamine  (ADDERALL) 20 MG tablet  Pt medication is to be sent to    [id number] [place], [place] - [address] (Ph: [phone])

## 2024-07-20 NOTE — Telephone Encounter (Signed)
 Requested Prescriptions   Pending Prescriptions Disp Refills   amphetamine -dextroamphetamine  (ADDERALL) 20 MG tablet 60 tablet 0    Sig: Take 1 tablet (20 mg total) by mouth 2 (two) times daily.     Last seen 04/13/24 Next appt 11/11/24  Dispenses   Dispensed Days Supply Quantity Provider Pharmacy  DEXTROAMP-AMPHETAMIN 20 MG TAB 06/18/2024 30 60 each Penumalli, Eduard SAUNDERS, MD [facility] [phone] - A...  DEXTROAMP-AMPHETAMIN 20 MG TAB 05/21/2024 30 60 each Sater, Charlie LABOR, MD [facility] [phone] - A...  DEXTROAMP-AMPHETAMIN 20 MG TAB 04/13/2024 60 60 each Sater, Charlie LABOR, MD [facility] [phone] - A...  DEXTROAMP-AMPHET ER 25 MG CAP 03/26/2024 30 30 each Sater, Charlie LABOR, MD [facility] [phone] - A...  DEXTROAMP-AMPHET ER 25 MG CAP 02/25/2024 30 30 capsule Vear Charlie LABOR, MD [facility] [phone] - A...  DEXTROAMP-AMPHET ER 25 MG CAP 01/21/2024 30 30 each Sater, Charlie LABOR, MD [facility] [phone] - A...  DEXTROAMP-AMPHET ER 25 MG CAP 12/23/2023 30 30 each Sater, Charlie LABOR, MD [facility] [phone] - A...  DEXTROAMP-AMPHET ER 25 MG CAP 11/20/2023 30 30 each Sater, Charlie LABOR, MD [facility] [phone] - A...  DEXTROAMP-AMPHET ER 25 MG CAP 10/21/2023 30 30 each Sater, Charlie LABOR, MD [facility] [phone] - A...  DEXTROAMP-AMPHET ER 25 MG CAP 09/19/2023 30 30 each Sater, Charlie LABOR, MD [facility] [phone] - A...  DEXTROAMP-AMPHET ER 25 MG CAP 08/19/2023 30 30 each Sater, Charlie LABOR, MD [facility] [phone] - A.SABRASABRA

## 2024-07-22 ENCOUNTER — Encounter: Payer: Self-pay | Admitting: Neurology

## 2024-08-24 ENCOUNTER — Telehealth: Payer: Self-pay | Admitting: Neurology

## 2024-08-24 NOTE — Telephone Encounter (Signed)
 Pt called to request medication refill  amphetamine -dextroamphetamine  (ADDERALL) 20 MG tablet  Pt medication is to be sent    [id number] [place], [place] - [address] (Ph: [phone])

## 2024-08-26 MED ORDER — AMPHETAMINE-DEXTROAMPHETAMINE 20 MG PO TABS: 20.0000 mg | ORAL_TABLET | Freq: Two times a day (BID) | ORAL | 0 refills | Status: DC

## 2024-08-26 NOTE — Telephone Encounter (Signed)
 Last Seen 04/13/24 Upcoming Appointment 11/11/24  Adderall last seen 07/20/24

## 2024-08-29 ENCOUNTER — Other Ambulatory Visit: Payer: Self-pay | Admitting: Neurology

## 2024-08-29 DIAGNOSIS — G40309 Generalized idiopathic epilepsy and epileptic syndromes, not intractable, without status epilepticus: Secondary | ICD-10-CM

## 2024-08-31 NOTE — Telephone Encounter (Signed)
 Last seen on 04/13/24 Follow up scheduled on 11/11/24

## 2024-09-29 ENCOUNTER — Other Ambulatory Visit: Payer: Self-pay | Admitting: Neurology

## 2024-09-29 ENCOUNTER — Telehealth: Payer: Self-pay

## 2024-09-29 MED ORDER — AMPHETAMINE-DEXTROAMPHETAMINE 20 MG PO TABS: 20.0000 mg | ORAL_TABLET | Freq: Two times a day (BID) | ORAL | 0 refills | Status: AC

## 2024-09-29 NOTE — Telephone Encounter (Signed)
 Faxed as requested

## 2024-09-29 NOTE — Telephone Encounter (Signed)
Pt is requesting a refill for amphetamine-dextroamphetamine (ADDERALL XR) 25 MG 24 hr capsule.  [id number]

## 2024-11-11 ENCOUNTER — Ambulatory Visit: Admitting: Neurology
